# Patient Record
Sex: Female | Born: 1955 | Race: White | Hispanic: No | Smoking: Never smoker
Health system: Southern US, Community
[De-identification: ages and names within clinical notes are randomized; demographics above are authoritative.]

## PROBLEM LIST (undated history)

## (undated) DIAGNOSIS — K219 Gastro-esophageal reflux disease without esophagitis: Secondary | ICD-10-CM

## (undated) HISTORY — PX: OTHER SURGICAL HISTORY: SHX169

## (undated) HISTORY — PX: ABDOMINAL HYSTERECTOMY: SHX81

## (undated) HISTORY — PX: TUBAL LIGATION: SHX77

## (undated) HISTORY — PX: TONSILLECTOMY: SUR1361

---

## 2000-06-26 ENCOUNTER — Other Ambulatory Visit: Admission: RE | Admit: 2000-06-26 | Discharge: 2000-06-26 | Payer: Self-pay | Admitting: Obstetrics and Gynecology

## 2002-04-20 ENCOUNTER — Ambulatory Visit (HOSPITAL_BASED_OUTPATIENT_CLINIC_OR_DEPARTMENT_OTHER): Admission: RE | Admit: 2002-04-20 | Discharge: 2002-04-20 | Payer: Self-pay | Admitting: Orthopedic Surgery

## 2002-06-09 ENCOUNTER — Encounter: Admission: RE | Admit: 2002-06-09 | Discharge: 2002-06-09 | Payer: Self-pay | Admitting: Obstetrics and Gynecology

## 2002-06-09 ENCOUNTER — Encounter: Payer: Self-pay | Admitting: Obstetrics and Gynecology

## 2006-09-24 ENCOUNTER — Encounter: Admission: RE | Admit: 2006-09-24 | Discharge: 2006-09-24 | Payer: Self-pay | Admitting: Obstetrics and Gynecology

## 2014-03-17 ENCOUNTER — Other Ambulatory Visit: Payer: Self-pay

## 2014-03-17 DIAGNOSIS — Z1231 Encounter for screening mammogram for malignant neoplasm of breast: Secondary | ICD-10-CM

## 2014-04-26 ENCOUNTER — Ambulatory Visit
Admission: RE | Admit: 2014-04-26 | Discharge: 2014-04-26 | Disposition: A | Discharge status: Home / Self Care | Payer: BC Managed Care – PPO | Source: Ambulatory Visit

## 2014-04-26 ENCOUNTER — Encounter (INDEPENDENT_AMBULATORY_CARE_PROVIDER_SITE_OTHER): Payer: Self-pay

## 2014-04-26 DIAGNOSIS — Z1231 Encounter for screening mammogram for malignant neoplasm of breast: Secondary | ICD-10-CM

## 2015-03-22 ENCOUNTER — Other Ambulatory Visit: Payer: Self-pay | Admitting: Physician Assistant

## 2015-03-22 ENCOUNTER — Other Ambulatory Visit (HOSPITAL_COMMUNITY)
Admission: RE | Admit: 2015-03-22 | Discharge: 2015-03-22 | Disposition: A | Discharge status: Home / Self Care | Payer: 59 | Source: Ambulatory Visit | Attending: Family Medicine | Admitting: Family Medicine

## 2015-03-22 DIAGNOSIS — Z124 Encounter for screening for malignant neoplasm of cervix: Secondary | ICD-10-CM | POA: Diagnosis not present

## 2015-03-23 LAB — CYTOLOGY - PAP

## 2016-06-20 DIAGNOSIS — Z1159 Encounter for screening for other viral diseases: Secondary | ICD-10-CM | POA: Diagnosis not present

## 2016-06-20 DIAGNOSIS — Z0000 Encounter for general adult medical examination without abnormal findings: Secondary | ICD-10-CM | POA: Diagnosis not present

## 2016-06-20 DIAGNOSIS — I872 Venous insufficiency (chronic) (peripheral): Secondary | ICD-10-CM | POA: Diagnosis not present

## 2016-06-20 DIAGNOSIS — R079 Chest pain, unspecified: Secondary | ICD-10-CM | POA: Diagnosis not present

## 2016-06-21 ENCOUNTER — Other Ambulatory Visit: Payer: Self-pay | Admitting: Family Medicine

## 2016-06-21 DIAGNOSIS — E2839 Other primary ovarian failure: Secondary | ICD-10-CM

## 2016-06-27 ENCOUNTER — Ambulatory Visit
Admission: RE | Admit: 2016-06-27 | Discharge: 2016-06-27 | Disposition: A | Discharge status: Home / Self Care | Payer: BLUE CROSS/BLUE SHIELD | Source: Ambulatory Visit | Attending: Family Medicine | Admitting: Family Medicine

## 2016-06-27 DIAGNOSIS — E2839 Other primary ovarian failure: Secondary | ICD-10-CM

## 2016-06-27 DIAGNOSIS — Z780 Asymptomatic menopausal state: Secondary | ICD-10-CM | POA: Diagnosis not present

## 2016-06-27 DIAGNOSIS — M810 Age-related osteoporosis without current pathological fracture: Secondary | ICD-10-CM | POA: Diagnosis not present

## 2016-07-17 DIAGNOSIS — R079 Chest pain, unspecified: Secondary | ICD-10-CM | POA: Diagnosis not present

## 2016-07-17 DIAGNOSIS — E785 Hyperlipidemia, unspecified: Secondary | ICD-10-CM | POA: Diagnosis not present

## 2016-07-26 DIAGNOSIS — R0789 Other chest pain: Secondary | ICD-10-CM | POA: Diagnosis not present

## 2016-07-26 DIAGNOSIS — E785 Hyperlipidemia, unspecified: Secondary | ICD-10-CM | POA: Diagnosis not present

## 2016-08-07 DIAGNOSIS — I10 Essential (primary) hypertension: Secondary | ICD-10-CM | POA: Diagnosis not present

## 2016-08-07 DIAGNOSIS — E785 Hyperlipidemia, unspecified: Secondary | ICD-10-CM | POA: Diagnosis not present

## 2016-08-07 DIAGNOSIS — M2550 Pain in unspecified joint: Secondary | ICD-10-CM | POA: Diagnosis not present

## 2016-08-07 DIAGNOSIS — R079 Chest pain, unspecified: Secondary | ICD-10-CM | POA: Diagnosis not present

## 2016-08-13 ENCOUNTER — Other Ambulatory Visit: Payer: Self-pay | Admitting: Family Medicine

## 2016-08-13 DIAGNOSIS — Z1231 Encounter for screening mammogram for malignant neoplasm of breast: Secondary | ICD-10-CM

## 2016-09-05 ENCOUNTER — Ambulatory Visit
Admission: RE | Admit: 2016-09-05 | Discharge: 2016-09-05 | Disposition: A | Discharge status: Home / Self Care | Payer: BLUE CROSS/BLUE SHIELD | Source: Ambulatory Visit | Attending: Family Medicine | Admitting: Family Medicine

## 2016-09-05 DIAGNOSIS — Z1231 Encounter for screening mammogram for malignant neoplasm of breast: Secondary | ICD-10-CM

## 2016-11-15 DIAGNOSIS — R05 Cough: Secondary | ICD-10-CM | POA: Diagnosis not present

## 2016-12-17 DIAGNOSIS — R69 Illness, unspecified: Secondary | ICD-10-CM | POA: Diagnosis not present

## 2017-07-03 DIAGNOSIS — Z0000 Encounter for general adult medical examination without abnormal findings: Secondary | ICD-10-CM | POA: Diagnosis not present

## 2017-07-03 DIAGNOSIS — M818 Other osteoporosis without current pathological fracture: Secondary | ICD-10-CM | POA: Diagnosis not present

## 2017-07-03 DIAGNOSIS — Z113 Encounter for screening for infections with a predominantly sexual mode of transmission: Secondary | ICD-10-CM | POA: Diagnosis not present

## 2017-07-03 DIAGNOSIS — K219 Gastro-esophageal reflux disease without esophagitis: Secondary | ICD-10-CM | POA: Diagnosis not present

## 2017-07-03 DIAGNOSIS — I872 Venous insufficiency (chronic) (peripheral): Secondary | ICD-10-CM | POA: Diagnosis not present

## 2017-07-03 DIAGNOSIS — M25551 Pain in right hip: Secondary | ICD-10-CM | POA: Diagnosis not present

## 2017-07-10 DIAGNOSIS — M1611 Unilateral primary osteoarthritis, right hip: Secondary | ICD-10-CM | POA: Diagnosis not present

## 2017-07-10 DIAGNOSIS — M79671 Pain in right foot: Secondary | ICD-10-CM | POA: Diagnosis not present

## 2017-10-09 DIAGNOSIS — M1611 Unilateral primary osteoarthritis, right hip: Secondary | ICD-10-CM | POA: Diagnosis not present

## 2017-11-12 DIAGNOSIS — M7062 Trochanteric bursitis, left hip: Secondary | ICD-10-CM | POA: Diagnosis not present

## 2017-12-11 DIAGNOSIS — K219 Gastro-esophageal reflux disease without esophagitis: Secondary | ICD-10-CM | POA: Diagnosis not present

## 2017-12-30 DIAGNOSIS — Z8379 Family history of other diseases of the digestive system: Secondary | ICD-10-CM | POA: Diagnosis not present

## 2017-12-30 DIAGNOSIS — R635 Abnormal weight gain: Secondary | ICD-10-CM | POA: Diagnosis not present

## 2017-12-30 DIAGNOSIS — K219 Gastro-esophageal reflux disease without esophagitis: Secondary | ICD-10-CM | POA: Diagnosis not present

## 2017-12-30 DIAGNOSIS — R1310 Dysphagia, unspecified: Secondary | ICD-10-CM | POA: Diagnosis not present

## 2017-12-31 DIAGNOSIS — R1310 Dysphagia, unspecified: Secondary | ICD-10-CM | POA: Diagnosis not present

## 2017-12-31 DIAGNOSIS — K2970 Gastritis, unspecified, without bleeding: Secondary | ICD-10-CM | POA: Diagnosis not present

## 2017-12-31 DIAGNOSIS — K200 Eosinophilic esophagitis: Secondary | ICD-10-CM | POA: Diagnosis not present

## 2017-12-31 DIAGNOSIS — K219 Gastro-esophageal reflux disease without esophagitis: Secondary | ICD-10-CM | POA: Diagnosis not present

## 2018-01-20 DIAGNOSIS — K219 Gastro-esophageal reflux disease without esophagitis: Secondary | ICD-10-CM | POA: Diagnosis not present

## 2018-01-20 DIAGNOSIS — K200 Eosinophilic esophagitis: Secondary | ICD-10-CM | POA: Diagnosis not present

## 2018-01-20 DIAGNOSIS — J069 Acute upper respiratory infection, unspecified: Secondary | ICD-10-CM | POA: Diagnosis not present

## 2018-04-08 DIAGNOSIS — M436 Torticollis: Secondary | ICD-10-CM | POA: Diagnosis not present

## 2018-07-16 DIAGNOSIS — E7800 Pure hypercholesterolemia, unspecified: Secondary | ICD-10-CM | POA: Diagnosis not present

## 2018-07-16 DIAGNOSIS — Z0000 Encounter for general adult medical examination without abnormal findings: Secondary | ICD-10-CM | POA: Diagnosis not present

## 2018-07-16 DIAGNOSIS — K219 Gastro-esophageal reflux disease without esophagitis: Secondary | ICD-10-CM | POA: Diagnosis not present

## 2018-07-17 ENCOUNTER — Other Ambulatory Visit: Payer: Self-pay | Admitting: Family Medicine

## 2018-07-17 DIAGNOSIS — M818 Other osteoporosis without current pathological fracture: Principal | ICD-10-CM

## 2018-07-17 DIAGNOSIS — M810 Age-related osteoporosis without current pathological fracture: Secondary | ICD-10-CM

## 2018-07-20 ENCOUNTER — Encounter: Payer: Self-pay | Admitting: Radiology

## 2018-07-20 ENCOUNTER — Ambulatory Visit
Admission: RE | Admit: 2018-07-20 | Discharge: 2018-07-20 | Disposition: A | Discharge status: Home / Self Care | Payer: BLUE CROSS/BLUE SHIELD | Source: Ambulatory Visit | Attending: Family Medicine | Admitting: Family Medicine

## 2018-07-20 DIAGNOSIS — Z780 Asymptomatic menopausal state: Secondary | ICD-10-CM | POA: Diagnosis not present

## 2018-07-20 DIAGNOSIS — M810 Age-related osteoporosis without current pathological fracture: Secondary | ICD-10-CM | POA: Diagnosis not present

## 2018-07-20 DIAGNOSIS — M8588 Other specified disorders of bone density and structure, other site: Secondary | ICD-10-CM | POA: Diagnosis not present

## 2018-07-20 DIAGNOSIS — M818 Other osteoporosis without current pathological fracture: Principal | ICD-10-CM

## 2018-08-13 DIAGNOSIS — M818 Other osteoporosis without current pathological fracture: Secondary | ICD-10-CM | POA: Diagnosis not present

## 2018-10-21 DIAGNOSIS — R52 Pain, unspecified: Secondary | ICD-10-CM | POA: Diagnosis not present

## 2018-10-21 DIAGNOSIS — M19041 Primary osteoarthritis, right hand: Secondary | ICD-10-CM | POA: Diagnosis not present

## 2018-10-21 DIAGNOSIS — M180 Bilateral primary osteoarthritis of first carpometacarpal joints: Secondary | ICD-10-CM | POA: Diagnosis not present

## 2018-10-21 DIAGNOSIS — M19042 Primary osteoarthritis, left hand: Secondary | ICD-10-CM | POA: Diagnosis not present

## 2019-04-15 DIAGNOSIS — R1310 Dysphagia, unspecified: Secondary | ICD-10-CM | POA: Diagnosis not present

## 2019-04-15 DIAGNOSIS — K219 Gastro-esophageal reflux disease without esophagitis: Secondary | ICD-10-CM | POA: Diagnosis not present

## 2019-04-15 DIAGNOSIS — K200 Eosinophilic esophagitis: Secondary | ICD-10-CM | POA: Diagnosis not present

## 2019-05-04 DIAGNOSIS — M1612 Unilateral primary osteoarthritis, left hip: Secondary | ICD-10-CM | POA: Diagnosis not present

## 2019-05-04 DIAGNOSIS — M1611 Unilateral primary osteoarthritis, right hip: Secondary | ICD-10-CM | POA: Diagnosis not present

## 2019-05-19 DIAGNOSIS — I872 Venous insufficiency (chronic) (peripheral): Secondary | ICD-10-CM | POA: Diagnosis not present

## 2019-05-19 DIAGNOSIS — Z01818 Encounter for other preprocedural examination: Secondary | ICD-10-CM | POA: Diagnosis not present

## 2019-05-19 DIAGNOSIS — Z01812 Encounter for preprocedural laboratory examination: Secondary | ICD-10-CM | POA: Diagnosis not present

## 2019-05-27 DIAGNOSIS — M1611 Unilateral primary osteoarthritis, right hip: Secondary | ICD-10-CM | POA: Diagnosis not present

## 2019-05-28 DIAGNOSIS — M19072 Primary osteoarthritis, left ankle and foot: Secondary | ICD-10-CM | POA: Diagnosis not present

## 2019-05-28 DIAGNOSIS — M79671 Pain in right foot: Secondary | ICD-10-CM | POA: Diagnosis not present

## 2019-05-28 DIAGNOSIS — M25571 Pain in right ankle and joints of right foot: Secondary | ICD-10-CM | POA: Diagnosis not present

## 2019-05-28 DIAGNOSIS — M1611 Unilateral primary osteoarthritis, right hip: Secondary | ICD-10-CM | POA: Diagnosis not present

## 2019-05-28 DIAGNOSIS — M19071 Primary osteoarthritis, right ankle and foot: Secondary | ICD-10-CM | POA: Diagnosis not present

## 2019-05-28 DIAGNOSIS — M79672 Pain in left foot: Secondary | ICD-10-CM | POA: Diagnosis not present

## 2019-05-28 DIAGNOSIS — M25572 Pain in left ankle and joints of left foot: Secondary | ICD-10-CM | POA: Diagnosis not present

## 2019-06-04 DIAGNOSIS — Z96651 Presence of right artificial knee joint: Secondary | ICD-10-CM | POA: Diagnosis not present

## 2019-06-04 DIAGNOSIS — M1611 Unilateral primary osteoarthritis, right hip: Secondary | ICD-10-CM | POA: Diagnosis not present

## 2019-06-07 ENCOUNTER — Other Ambulatory Visit (HOSPITAL_COMMUNITY): Payer: Self-pay | Admitting: Orthopedic Surgery

## 2019-06-07 ENCOUNTER — Ambulatory Visit (HOSPITAL_COMMUNITY)
Admission: RE | Admit: 2019-06-07 | Discharge: 2019-06-07 | Disposition: A | Discharge status: Home / Self Care | Payer: BLUE CROSS/BLUE SHIELD | Source: Ambulatory Visit | Attending: Orthopedic Surgery | Admitting: Orthopedic Surgery

## 2019-06-07 ENCOUNTER — Other Ambulatory Visit: Payer: Self-pay

## 2019-06-07 DIAGNOSIS — M25551 Pain in right hip: Secondary | ICD-10-CM | POA: Insufficient documentation

## 2019-06-07 DIAGNOSIS — Z96641 Presence of right artificial hip joint: Secondary | ICD-10-CM | POA: Insufficient documentation

## 2019-06-07 DIAGNOSIS — M7989 Other specified soft tissue disorders: Secondary | ICD-10-CM | POA: Insufficient documentation

## 2019-06-07 DIAGNOSIS — M79604 Pain in right leg: Secondary | ICD-10-CM

## 2019-06-07 NOTE — Progress Notes (Signed)
RLE venous duplex       has been completed. Preliminary results can be found under CV proc through chart review. June Leap, BS, RDMS, RVT    Attempted call report to Dr. Mayer Camel with no answer.

## 2019-10-15 ENCOUNTER — Other Ambulatory Visit: Payer: Self-pay | Admitting: Family Medicine

## 2019-10-15 DIAGNOSIS — Z1231 Encounter for screening mammogram for malignant neoplasm of breast: Secondary | ICD-10-CM

## 2019-10-26 DIAGNOSIS — M67911 Unspecified disorder of synovium and tendon, right shoulder: Secondary | ICD-10-CM | POA: Diagnosis not present

## 2019-10-26 DIAGNOSIS — M25551 Pain in right hip: Secondary | ICD-10-CM | POA: Diagnosis not present

## 2019-10-26 DIAGNOSIS — M25552 Pain in left hip: Secondary | ICD-10-CM | POA: Diagnosis not present

## 2019-11-09 DIAGNOSIS — I83892 Varicose veins of left lower extremities with other complications: Secondary | ICD-10-CM | POA: Diagnosis not present

## 2019-11-09 DIAGNOSIS — I8312 Varicose veins of left lower extremity with inflammation: Secondary | ICD-10-CM | POA: Diagnosis not present

## 2019-11-25 DIAGNOSIS — I8312 Varicose veins of left lower extremity with inflammation: Secondary | ICD-10-CM | POA: Diagnosis not present

## 2020-01-21 DIAGNOSIS — Z0000 Encounter for general adult medical examination without abnormal findings: Secondary | ICD-10-CM | POA: Diagnosis not present

## 2020-01-21 DIAGNOSIS — M545 Low back pain: Secondary | ICD-10-CM | POA: Diagnosis not present

## 2020-02-01 ENCOUNTER — Other Ambulatory Visit: Payer: Self-pay | Admitting: Family Medicine

## 2020-02-01 DIAGNOSIS — M810 Age-related osteoporosis without current pathological fracture: Secondary | ICD-10-CM

## 2020-02-08 DIAGNOSIS — Z13220 Encounter for screening for lipoid disorders: Secondary | ICD-10-CM | POA: Diagnosis not present

## 2020-02-08 DIAGNOSIS — Z0000 Encounter for general adult medical examination without abnormal findings: Secondary | ICD-10-CM | POA: Diagnosis not present

## 2020-07-18 DIAGNOSIS — K200 Eosinophilic esophagitis: Secondary | ICD-10-CM | POA: Diagnosis not present

## 2020-07-18 DIAGNOSIS — K625 Hemorrhage of anus and rectum: Secondary | ICD-10-CM | POA: Diagnosis not present

## 2020-07-18 DIAGNOSIS — E669 Obesity, unspecified: Secondary | ICD-10-CM | POA: Diagnosis not present

## 2020-07-18 DIAGNOSIS — K219 Gastro-esophageal reflux disease without esophagitis: Secondary | ICD-10-CM | POA: Diagnosis not present

## 2020-07-20 ENCOUNTER — Other Ambulatory Visit: Payer: Self-pay | Admitting: Family Medicine

## 2020-07-20 DIAGNOSIS — Z1231 Encounter for screening mammogram for malignant neoplasm of breast: Secondary | ICD-10-CM

## 2020-11-02 ENCOUNTER — Other Ambulatory Visit: Payer: BLUE CROSS/BLUE SHIELD

## 2020-11-02 ENCOUNTER — Ambulatory Visit: Payer: BLUE CROSS/BLUE SHIELD

## 2021-10-04 DIAGNOSIS — M7061 Trochanteric bursitis, right hip: Secondary | ICD-10-CM | POA: Diagnosis not present

## 2021-11-15 DIAGNOSIS — M7989 Other specified soft tissue disorders: Secondary | ICD-10-CM | POA: Diagnosis not present

## 2021-11-15 DIAGNOSIS — I87392 Chronic venous hypertension (idiopathic) with other complications of left lower extremity: Secondary | ICD-10-CM | POA: Diagnosis not present

## 2021-11-15 DIAGNOSIS — I872 Venous insufficiency (chronic) (peripheral): Secondary | ICD-10-CM | POA: Diagnosis not present

## 2021-12-13 DIAGNOSIS — K200 Eosinophilic esophagitis: Secondary | ICD-10-CM | POA: Diagnosis not present

## 2021-12-13 DIAGNOSIS — E669 Obesity, unspecified: Secondary | ICD-10-CM | POA: Diagnosis not present

## 2021-12-13 DIAGNOSIS — K219 Gastro-esophageal reflux disease without esophagitis: Secondary | ICD-10-CM | POA: Diagnosis not present

## 2022-02-26 DIAGNOSIS — K219 Gastro-esophageal reflux disease without esophagitis: Secondary | ICD-10-CM | POA: Diagnosis not present

## 2022-02-26 DIAGNOSIS — M818 Other osteoporosis without current pathological fracture: Secondary | ICD-10-CM | POA: Diagnosis not present

## 2022-02-26 DIAGNOSIS — F430 Acute stress reaction: Secondary | ICD-10-CM | POA: Diagnosis not present

## 2022-02-26 DIAGNOSIS — Z6831 Body mass index (BMI) 31.0-31.9, adult: Secondary | ICD-10-CM | POA: Diagnosis not present

## 2022-02-27 ENCOUNTER — Other Ambulatory Visit: Payer: Self-pay | Admitting: Family Medicine

## 2022-02-27 DIAGNOSIS — M810 Age-related osteoporosis without current pathological fracture: Secondary | ICD-10-CM

## 2022-03-11 ENCOUNTER — Other Ambulatory Visit: Payer: Self-pay | Admitting: Family Medicine

## 2022-03-11 ENCOUNTER — Other Ambulatory Visit (HOSPITAL_BASED_OUTPATIENT_CLINIC_OR_DEPARTMENT_OTHER): Payer: Self-pay | Admitting: Family Medicine

## 2022-03-11 ENCOUNTER — Observation Stay (HOSPITAL_COMMUNITY)
Admission: EM | Admit: 2022-03-11 | Discharge: 2022-03-12 | Disposition: A | Discharge status: Home / Self Care | Payer: PPO | Attending: General Surgery | Admitting: General Surgery

## 2022-03-11 ENCOUNTER — Observation Stay (HOSPITAL_COMMUNITY): Payer: PPO | Admitting: Anesthesiology

## 2022-03-11 ENCOUNTER — Other Ambulatory Visit (HOSPITAL_COMMUNITY): Payer: Self-pay | Admitting: Family Medicine

## 2022-03-11 ENCOUNTER — Encounter (HOSPITAL_COMMUNITY): Payer: Self-pay | Admitting: Emergency Medicine

## 2022-03-11 ENCOUNTER — Observation Stay (HOSPITAL_BASED_OUTPATIENT_CLINIC_OR_DEPARTMENT_OTHER)
Admission: RE | Admit: 2022-03-11 | Discharge: 2022-03-11 | Disposition: A | Discharge status: Home / Self Care | Payer: PPO | Source: Ambulatory Visit | Attending: Family Medicine | Admitting: Family Medicine

## 2022-03-11 ENCOUNTER — Encounter (HOSPITAL_COMMUNITY)
Admission: EM | Disposition: A | Discharge status: Home / Self Care | Payer: Self-pay | Source: Home / Self Care | Attending: Emergency Medicine

## 2022-03-11 ENCOUNTER — Other Ambulatory Visit: Payer: Self-pay

## 2022-03-11 DIAGNOSIS — K3580 Unspecified acute appendicitis: Secondary | ICD-10-CM | POA: Diagnosis not present

## 2022-03-11 DIAGNOSIS — K3533 Acute appendicitis with perforation and localized peritonitis, with abscess: Secondary | ICD-10-CM

## 2022-03-11 DIAGNOSIS — M810 Age-related osteoporosis without current pathological fracture: Secondary | ICD-10-CM | POA: Insufficient documentation

## 2022-03-11 DIAGNOSIS — K219 Gastro-esophageal reflux disease without esophagitis: Secondary | ICD-10-CM | POA: Diagnosis not present

## 2022-03-11 DIAGNOSIS — R109 Unspecified abdominal pain: Secondary | ICD-10-CM | POA: Insufficient documentation

## 2022-03-11 DIAGNOSIS — R1032 Left lower quadrant pain: Secondary | ICD-10-CM

## 2022-03-11 DIAGNOSIS — R1030 Lower abdominal pain, unspecified: Secondary | ICD-10-CM | POA: Diagnosis present

## 2022-03-11 DIAGNOSIS — Z91040 Latex allergy status: Secondary | ICD-10-CM | POA: Insufficient documentation

## 2022-03-11 DIAGNOSIS — I872 Venous insufficiency (chronic) (peripheral): Secondary | ICD-10-CM | POA: Insufficient documentation

## 2022-03-11 DIAGNOSIS — R100 Acute abdomen: Secondary | ICD-10-CM | POA: Diagnosis not present

## 2022-03-11 DIAGNOSIS — K388 Other specified diseases of appendix: Secondary | ICD-10-CM | POA: Insufficient documentation

## 2022-03-11 DIAGNOSIS — F430 Acute stress reaction: Secondary | ICD-10-CM | POA: Insufficient documentation

## 2022-03-11 DIAGNOSIS — Z79899 Other long term (current) drug therapy: Secondary | ICD-10-CM | POA: Insufficient documentation

## 2022-03-11 DIAGNOSIS — I700 Atherosclerosis of aorta: Secondary | ICD-10-CM | POA: Diagnosis not present

## 2022-03-11 DIAGNOSIS — K3589 Other acute appendicitis without perforation or gangrene: Secondary | ICD-10-CM | POA: Diagnosis present

## 2022-03-11 DIAGNOSIS — K3521 Acute appendicitis with generalized peritonitis, with abscess: Secondary | ICD-10-CM | POA: Diagnosis not present

## 2022-03-11 DIAGNOSIS — Z6831 Body mass index (BMI) 31.0-31.9, adult: Secondary | ICD-10-CM | POA: Insufficient documentation

## 2022-03-11 DIAGNOSIS — R112 Nausea with vomiting, unspecified: Secondary | ICD-10-CM

## 2022-03-11 DIAGNOSIS — D121 Benign neoplasm of appendix: Secondary | ICD-10-CM | POA: Diagnosis not present

## 2022-03-11 HISTORY — PX: LAPAROSCOPY: SHX197

## 2022-03-11 HISTORY — PX: LAPAROSCOPIC LYSIS OF ADHESIONS: SHX5905

## 2022-03-11 HISTORY — PX: LAPAROSCOPIC APPENDECTOMY: SHX408

## 2022-03-11 HISTORY — DX: Gastro-esophageal reflux disease without esophagitis: K21.9

## 2022-03-11 LAB — COMPREHENSIVE METABOLIC PANEL
ALT: 17 U/L (ref 0–44)
AST: 33 U/L (ref 15–41)
Albumin: 4.4 g/dL (ref 3.5–5.0)
Alkaline Phosphatase: 73 U/L (ref 38–126)
Anion gap: 8 (ref 5–15)
BUN: 7 mg/dL — ABNORMAL LOW (ref 8–23)
CO2: 25 mmol/L (ref 22–32)
Calcium: 9.6 mg/dL (ref 8.9–10.3)
Chloride: 103 mmol/L (ref 98–111)
Creatinine, Ser: 0.73 mg/dL (ref 0.44–1.00)
GFR, Estimated: >60 mL/min (ref >60)
Glucose, Bld: 104 mg/dL — ABNORMAL HIGH (ref 70–99)
Potassium: 4.2 mmol/L (ref 3.5–5.1)
Sodium: 136 mmol/L (ref 135–145)
Total Bilirubin: 1.5 mg/dL — ABNORMAL HIGH (ref 0.3–1.2)
Total Protein: 8.2 g/dL — ABNORMAL HIGH (ref 6.5–8.1)

## 2022-03-11 LAB — CBC WITH DIFFERENTIAL/PLATELET
Abs Immature Granulocytes: 0.05 10*3/uL (ref 0.00–0.07)
Basophils Absolute: 0.1 10*3/uL (ref 0.0–0.1)
Basophils Relative: 0 %
Eosinophils Absolute: 0.1 10*3/uL (ref 0.0–0.5)
Eosinophils Relative: 0 %
HCT: 40.9 % (ref 36.0–46.0)
Hemoglobin: 14.2 g/dL (ref 12.0–15.0)
Immature Granulocytes: 0 %
Lymphocytes Relative: 13 %
Lymphs Abs: 2.1 10*3/uL (ref 0.7–4.0)
MCH: 33.7 pg (ref 26.0–34.0)
MCHC: 34.7 g/dL (ref 30.0–36.0)
MCV: 97.1 fL (ref 80.0–100.0)
Monocytes Absolute: 1.7 10*3/uL — ABNORMAL HIGH (ref 0.1–1.0)
Monocytes Relative: 10 %
Neutro Abs: 12.7 10*3/uL — ABNORMAL HIGH (ref 1.7–7.7)
Neutrophils Relative %: 77 %
Platelets: 386 10*3/uL (ref 150–400)
RBC: 4.21 MIL/uL (ref 3.87–5.11)
RDW: 12.4 % (ref 11.5–15.5)
WBC: 16.7 10*3/uL — ABNORMAL HIGH (ref 4.0–10.5)
nRBC: 0 % (ref 0.0–0.2)

## 2022-03-11 SURGERY — APPENDECTOMY, LAPAROSCOPIC
Anesthesia: General | Site: Abdomen

## 2022-03-11 MED ORDER — LACTATED RINGERS IV SOLN
INTRAVENOUS | Status: DC
Start: 2022-03-11 — End: 2022-03-11

## 2022-03-11 MED ORDER — SODIUM CHLORIDE 0.9 % IV SOLN: Freq: Three times a day (TID) | INTRAVENOUS | Status: DC | PRN

## 2022-03-11 MED ORDER — CHLORHEXIDINE GLUCONATE 0.12 % MT SOLN
15.0000 mL | Freq: Once | OROMUCOSAL | Status: AC
  Administered 2022-03-11: 15 mL via OROMUCOSAL

## 2022-03-11 MED ORDER — ONDANSETRON 4 MG PO TBDP: 4.0000 mg | ORAL_TABLET | Freq: Four times a day (QID) | ORAL | Status: DC | PRN

## 2022-03-11 MED ORDER — FENTANYL CITRATE (PF) 250 MCG/5ML IJ SOLN
INTRAMUSCULAR | Status: DC | PRN
  Administered 2022-03-11: 150 ug via INTRAVENOUS

## 2022-03-11 MED ORDER — BUPIVACAINE-EPINEPHRINE 0.25% -1:200000 IJ SOLN
INTRAMUSCULAR | Status: AC
  Filled 2022-03-11: qty 1

## 2022-03-11 MED ORDER — HYDROMORPHONE HCL 1 MG/ML IJ SOLN: 0.5000 mg | INTRAMUSCULAR | Status: DC | PRN

## 2022-03-11 MED ORDER — MIDAZOLAM HCL 2 MG/2ML IJ SOLN
INTRAMUSCULAR | Status: AC
  Filled 2022-03-11: qty 2

## 2022-03-11 MED ORDER — PROPOFOL 10 MG/ML IV BOLUS
INTRAVENOUS | Status: DC | PRN
  Administered 2022-03-11: 150 mg via INTRAVENOUS

## 2022-03-11 MED ORDER — METHOCARBAMOL 1000 MG/10ML IJ SOLN
1000.0000 mg | Freq: Four times a day (QID) | INTRAVENOUS | Status: DC | PRN
  Filled 2022-03-11: qty 10

## 2022-03-11 MED ORDER — LIP MEDEX EX OINT
1.0000 | TOPICAL_OINTMENT | Freq: Two times a day (BID) | CUTANEOUS | Status: DC
Start: 2022-03-11 — End: 2022-03-12
  Administered 2022-03-11 – 2022-03-12 (×2): 1 via TOPICAL
  Filled 2022-03-11: qty 7

## 2022-03-11 MED ORDER — SUCCINYLCHOLINE CHLORIDE 200 MG/10ML IV SOSY
PREFILLED_SYRINGE | INTRAVENOUS | Status: DC | PRN
Start: 2022-03-11 — End: 2022-03-11
  Administered 2022-03-11: 80 mg via INTRAVENOUS

## 2022-03-11 MED ORDER — MIDAZOLAM HCL 5 MG/5ML IJ SOLN
INTRAMUSCULAR | Status: DC | PRN
  Administered 2022-03-11: 2 mg via INTRAVENOUS

## 2022-03-11 MED ORDER — SODIUM CHLORIDE 0.9% FLUSH: 3.0000 mL | Freq: Two times a day (BID) | INTRAVENOUS | Status: DC

## 2022-03-11 MED ORDER — METOPROLOL TARTRATE 5 MG/5ML IV SOLN: 5.0000 mg | Freq: Four times a day (QID) | INTRAVENOUS | Status: DC | PRN

## 2022-03-11 MED ORDER — BISACODYL 10 MG RE SUPP
10.0000 mg | Freq: Every day | RECTAL | Status: DC
Start: 2022-03-11 — End: 2022-03-12
  Filled 2022-03-11: qty 1

## 2022-03-11 MED ORDER — DIPHENHYDRAMINE HCL 50 MG/ML IJ SOLN
12.5000 mg | Freq: Four times a day (QID) | INTRAMUSCULAR | Status: DC | PRN
Start: 2022-03-11 — End: 2022-03-12

## 2022-03-11 MED ORDER — MAGIC MOUTHWASH
15.0000 mL | Freq: Four times a day (QID) | ORAL | Status: DC | PRN
Start: 2022-03-11 — End: 2022-03-12
  Filled 2022-03-11: qty 15

## 2022-03-11 MED ORDER — ACETAMINOPHEN 500 MG PO TABS
1000.0000 mg | ORAL_TABLET | Freq: Four times a day (QID) | ORAL | Status: DC
  Administered 2022-03-11 – 2022-03-12 (×2): 1000 mg via ORAL
  Filled 2022-03-11 (×2): qty 2

## 2022-03-11 MED ORDER — FENTANYL CITRATE (PF) 250 MCG/5ML IJ SOLN
INTRAMUSCULAR | Status: AC
  Filled 2022-03-11: qty 5

## 2022-03-11 MED ORDER — DIPHENHYDRAMINE HCL 12.5 MG/5ML PO ELIX: 12.5000 mg | ORAL_SOLUTION | Freq: Four times a day (QID) | ORAL | Status: DC | PRN

## 2022-03-11 MED ORDER — ONDANSETRON HCL 4 MG/2ML IJ SOLN
INTRAMUSCULAR | Status: DC | PRN
  Administered 2022-03-11: 4 mg via INTRAVENOUS

## 2022-03-11 MED ORDER — MORPHINE SULFATE (PF) 2 MG/ML IV SOLN: 2.0000 mg | INTRAVENOUS | Status: DC | PRN

## 2022-03-11 MED ORDER — GENTAMICIN SULFATE 40 MG/ML IJ SOLN
7.0000 mg/kg | INTRAVENOUS | Status: DC
  Administered 2022-03-11: 400 mg via INTRAVENOUS
  Filled 2022-03-11 (×2): qty 10

## 2022-03-11 MED ORDER — METRONIDAZOLE 500 MG/100ML IV SOLN
500.0000 mg | Freq: Two times a day (BID) | INTRAVENOUS | Status: DC
  Administered 2022-03-12: 500 mg via INTRAVENOUS
  Filled 2022-03-11: qty 100

## 2022-03-11 MED ORDER — PROCHLORPERAZINE MALEATE 10 MG PO TABS
10.0000 mg | ORAL_TABLET | Freq: Four times a day (QID) | ORAL | Status: DC | PRN
  Filled 2022-03-11: qty 1

## 2022-03-11 MED ORDER — ROCURONIUM BROMIDE 10 MG/ML (PF) SYRINGE
PREFILLED_SYRINGE | INTRAVENOUS | Status: DC | PRN
  Administered 2022-03-11: 40 mg via INTRAVENOUS

## 2022-03-11 MED ORDER — PHENYLEPHRINE 80 MCG/ML (10ML) SYRINGE FOR IV PUSH (FOR BLOOD PRESSURE SUPPORT)
PREFILLED_SYRINGE | INTRAVENOUS | Status: AC
  Filled 2022-03-11: qty 20

## 2022-03-11 MED ORDER — ACETAMINOPHEN 325 MG PO TABS: 650.0000 mg | ORAL_TABLET | Freq: Four times a day (QID) | ORAL | Status: DC | PRN

## 2022-03-11 MED ORDER — BUPIVACAINE-EPINEPHRINE 0.25% -1:200000 IJ SOLN
INTRAMUSCULAR | Status: DC | PRN
  Administered 2022-03-11: 50 mL

## 2022-03-11 MED ORDER — LACTATED RINGERS IR SOLN
Status: DC | PRN
  Administered 2022-03-11: 2000 mL

## 2022-03-11 MED ORDER — CIPROFLOXACIN IN D5W 400 MG/200ML IV SOLN
400.0000 mg | INTRAVENOUS | Status: AC
  Administered 2022-03-11: 400 mg via INTRAVENOUS
  Filled 2022-03-11: qty 200

## 2022-03-11 MED ORDER — CIPROFLOXACIN IN D5W 400 MG/200ML IV SOLN
400.0000 mg | Freq: Two times a day (BID) | INTRAVENOUS | Status: DC
  Administered 2022-03-12: 400 mg via INTRAVENOUS
  Filled 2022-03-11: qty 200

## 2022-03-11 MED ORDER — CHLORHEXIDINE GLUCONATE CLOTH 2 % EX PADS
6.0000 | MEDICATED_PAD | Freq: Once | CUTANEOUS | Status: AC
  Administered 2022-03-11: 6 via TOPICAL

## 2022-03-11 MED ORDER — TRAMADOL HCL 50 MG PO TABS
50.0000 mg | ORAL_TABLET | Freq: Four times a day (QID) | ORAL | Status: DC | PRN
  Administered 2022-03-11: 50 mg via ORAL
  Filled 2022-03-11: qty 1

## 2022-03-11 MED ORDER — FENTANYL CITRATE PF 50 MCG/ML IJ SOSY: 25.0000 ug | PREFILLED_SYRINGE | INTRAMUSCULAR | Status: DC | PRN

## 2022-03-11 MED ORDER — KETOROLAC TROMETHAMINE 15 MG/ML IJ SOLN: 15.0000 mg | Freq: Once | INTRAMUSCULAR | Status: DC | PRN

## 2022-03-11 MED ORDER — BUPIVACAINE LIPOSOME 1.3 % IJ SUSP
INTRAMUSCULAR | Status: AC
  Filled 2022-03-11: qty 20

## 2022-03-11 MED ORDER — PROPOFOL 10 MG/ML IV BOLUS
INTRAVENOUS | Status: AC
  Filled 2022-03-11: qty 20

## 2022-03-11 MED ORDER — ENALAPRILAT 1.25 MG/ML IV SOLN
0.6250 mg | Freq: Four times a day (QID) | INTRAVENOUS | Status: DC | PRN
  Filled 2022-03-11: qty 1

## 2022-03-11 MED ORDER — ONDANSETRON HCL 4 MG/2ML IJ SOLN: 4.0000 mg | Freq: Four times a day (QID) | INTRAMUSCULAR | Status: DC | PRN

## 2022-03-11 MED ORDER — LACTATED RINGERS IV SOLN: INTRAVENOUS | Status: AC

## 2022-03-11 MED ORDER — GABAPENTIN 300 MG PO CAPS
300.0000 mg | ORAL_CAPSULE | Freq: Two times a day (BID) | ORAL | Status: DC
  Administered 2022-03-11: 300 mg via ORAL
  Filled 2022-03-11: qty 1

## 2022-03-11 MED ORDER — SUCCINYLCHOLINE CHLORIDE 200 MG/10ML IV SOSY
PREFILLED_SYRINGE | INTRAVENOUS | Status: AC
  Filled 2022-03-11: qty 10

## 2022-03-11 MED ORDER — METHOCARBAMOL 500 MG PO TABS: 500.0000 mg | ORAL_TABLET | Freq: Four times a day (QID) | ORAL | Status: DC | PRN

## 2022-03-11 MED ORDER — ONDANSETRON HCL 4 MG/2ML IJ SOLN: 4.0000 mg | Freq: Once | INTRAMUSCULAR | Status: DC | PRN

## 2022-03-11 MED ORDER — MORPHINE SULFATE (PF) 4 MG/ML IV SOLN
4.0000 mg | Freq: Once | INTRAVENOUS | Status: AC
  Administered 2022-03-11: 4 mg via INTRAVENOUS
  Filled 2022-03-11: qty 1

## 2022-03-11 MED ORDER — ROCURONIUM BROMIDE 10 MG/ML (PF) SYRINGE
PREFILLED_SYRINGE | INTRAVENOUS | Status: AC
  Filled 2022-03-11: qty 10

## 2022-03-11 MED ORDER — PANTOPRAZOLE SODIUM 40 MG IV SOLR
40.0000 mg | INTRAVENOUS | Status: DC
  Filled 2022-03-11: qty 10

## 2022-03-11 MED ORDER — SODIUM CHLORIDE 0.9 % IV BOLUS
1000.0000 mL | Freq: Once | INTRAVENOUS | Status: AC
  Administered 2022-03-11: 1000 mL via INTRAVENOUS

## 2022-03-11 MED ORDER — DEXAMETHASONE SODIUM PHOSPHATE 10 MG/ML IJ SOLN
INTRAMUSCULAR | Status: DC | PRN
  Administered 2022-03-11: 8 mg via INTRAVENOUS

## 2022-03-11 MED ORDER — MAGNESIUM HYDROXIDE 400 MG/5ML PO SUSP: 30.0000 mL | Freq: Every day | ORAL | Status: DC | PRN

## 2022-03-11 MED ORDER — TRAMADOL HCL 50 MG PO TABS: 50.0000 mg | ORAL_TABLET | Freq: Four times a day (QID) | ORAL | Status: DC | PRN

## 2022-03-11 MED ORDER — LIDOCAINE HCL (PF) 2 % IJ SOLN
INTRAMUSCULAR | Status: AC
  Filled 2022-03-11: qty 5

## 2022-03-11 MED ORDER — ONDANSETRON HCL 4 MG/2ML IJ SOLN
INTRAMUSCULAR | Status: AC
  Filled 2022-03-11: qty 2

## 2022-03-11 MED ORDER — ACETAMINOPHEN 10 MG/ML IV SOLN: 1000.0000 mg | Freq: Once | INTRAVENOUS | Status: DC | PRN

## 2022-03-11 MED ORDER — 0.9 % SODIUM CHLORIDE (POUR BTL) OPTIME
TOPICAL | Status: DC | PRN
  Administered 2022-03-11: 1000 mL

## 2022-03-11 MED ORDER — LACTATED RINGERS IV SOLN: INTRAVENOUS | Status: DC

## 2022-03-11 MED ORDER — FENTANYL CITRATE (PF) 100 MCG/2ML IJ SOLN
INTRAMUSCULAR | Status: DC | PRN
  Administered 2022-03-11: 50 ug via INTRAVENOUS
  Administered 2022-03-11: 100 ug via INTRAVENOUS

## 2022-03-11 MED ORDER — LACTATED RINGERS IV BOLUS
1000.0000 mL | Freq: Three times a day (TID) | INTRAVENOUS | Status: DC | PRN
Start: 2022-03-11 — End: 2022-03-12

## 2022-03-11 MED ORDER — ADULT MULTIVITAMIN W/MINERALS CH
1.0000 | ORAL_TABLET | Freq: Every day | ORAL | Status: DC
  Administered 2022-03-11 – 2022-03-12 (×2): 1 via ORAL
  Filled 2022-03-11 (×2): qty 1

## 2022-03-11 MED ORDER — LIDOCAINE 2% (20 MG/ML) 5 ML SYRINGE
INTRAMUSCULAR | Status: DC | PRN
  Administered 2022-03-11: 60 mg via INTRAVENOUS

## 2022-03-11 MED ORDER — ENOXAPARIN SODIUM 40 MG/0.4ML IJ SOSY
40.0000 mg | PREFILLED_SYRINGE | INTRAMUSCULAR | Status: DC
  Administered 2022-03-12: 40 mg via SUBCUTANEOUS
  Filled 2022-03-11: qty 0.4

## 2022-03-11 MED ORDER — ONDANSETRON HCL 4 MG/2ML IJ SOLN
4.0000 mg | Freq: Once | INTRAMUSCULAR | Status: AC
Start: 2022-03-11 — End: 2022-03-11
  Administered 2022-03-11: 4 mg via INTRAVENOUS
  Filled 2022-03-11: qty 2

## 2022-03-11 MED ORDER — AMISULPRIDE (ANTIEMETIC) 5 MG/2ML IV SOLN: 10.0000 mg | Freq: Once | INTRAVENOUS | Status: DC | PRN

## 2022-03-11 MED ORDER — PROCHLORPERAZINE EDISYLATE 10 MG/2ML IJ SOLN: 5.0000 mg | Freq: Four times a day (QID) | INTRAMUSCULAR | Status: DC | PRN

## 2022-03-11 MED ORDER — PHENYLEPHRINE 80 MCG/ML (10ML) SYRINGE FOR IV PUSH (FOR BLOOD PRESSURE SUPPORT)
PREFILLED_SYRINGE | INTRAVENOUS | Status: DC | PRN
  Administered 2022-03-11: 80 ug via INTRAVENOUS
  Administered 2022-03-11: 120 ug via INTRAVENOUS
  Administered 2022-03-11 (×2): 80 ug via INTRAVENOUS
  Administered 2022-03-11 (×3): 120 ug via INTRAVENOUS

## 2022-03-11 MED ORDER — DEXAMETHASONE SODIUM PHOSPHATE 10 MG/ML IJ SOLN
INTRAMUSCULAR | Status: AC
  Filled 2022-03-11: qty 1

## 2022-03-11 MED ORDER — BUPIVACAINE LIPOSOME 1.3 % IJ SUSP
INTRAMUSCULAR | Status: DC | PRN
  Administered 2022-03-11: 20 mL

## 2022-03-11 MED ORDER — SIMETHICONE 80 MG PO CHEW: 40.0000 mg | CHEWABLE_TABLET | Freq: Four times a day (QID) | ORAL | Status: DC | PRN

## 2022-03-11 MED ORDER — SODIUM CHLORIDE 0.9 % IV SOLN: 250.0000 mL | INTRAVENOUS | Status: DC | PRN

## 2022-03-11 MED ORDER — SODIUM CHLORIDE 0.9% FLUSH: 3.0000 mL | INTRAVENOUS | Status: DC | PRN

## 2022-03-11 MED ORDER — METRONIDAZOLE 500 MG/100ML IV SOLN
500.0000 mg | INTRAVENOUS | Status: AC
  Administered 2022-03-11: 500 mg via INTRAVENOUS
  Filled 2022-03-11: qty 100

## 2022-03-11 MED ORDER — BISACODYL 10 MG RE SUPP: 10.0000 mg | Freq: Every day | RECTAL | Status: DC | PRN

## 2022-03-11 MED ORDER — SUGAMMADEX SODIUM 200 MG/2ML IV SOLN
INTRAVENOUS | Status: DC | PRN
  Administered 2022-03-11: 200 mg via INTRAVENOUS

## 2022-03-11 MED ORDER — IOHEXOL 300 MG/ML  SOLN
100.0000 mL | Freq: Once | INTRAMUSCULAR | Status: AC | PRN
  Administered 2022-03-11: 75 mL via INTRAVENOUS

## 2022-03-11 SURGICAL SUPPLY — 48 items
ADH SKN CLS APL DERMABOND .7 (GAUZE/BANDAGES/DRESSINGS) ×1
APL PRP STRL LF DISP 70% ISPRP (MISCELLANEOUS) ×1
APPLIER CLIP ROT 10 11.4 M/L (STAPLE)
APR CLP MED LRG 11.4X10 (STAPLE)
BAG COUNTER SPONGE SURGICOUNT (BAG) IMPLANT
BAG RETRIEVAL 10 (BASKET) ×1
BAG SPNG CNTER NS LX DISP (BAG)
CABLE HIGH FREQUENCY MONO STRZ (ELECTRODE) ×2 IMPLANT
CHLORAPREP W/TINT 26 (MISCELLANEOUS) ×2 IMPLANT
CLIP APPLIE ROT 10 11.4 M/L (STAPLE) IMPLANT
CUTTER FLEX LINEAR 45M (STAPLE) IMPLANT
DERMABOND ADVANCED (GAUZE/BANDAGES/DRESSINGS) ×1
DERMABOND ADVANCED .7 DNX12 (GAUZE/BANDAGES/DRESSINGS) ×1 IMPLANT
DRSG TEGADERM 2-3/8X2-3/4 SM (GAUZE/BANDAGES/DRESSINGS) ×2 IMPLANT
DRSG TEGADERM 4X4.75 (GAUZE/BANDAGES/DRESSINGS) ×1 IMPLANT
ELECT REM PT RETURN 15FT ADLT (MISCELLANEOUS) ×2 IMPLANT
ENDOLOOP SUT PDS II  0 18 (SUTURE)
ENDOLOOP SUT PDS II 0 18 (SUTURE) IMPLANT
GAUZE SPONGE 2X2 8PLY STRL LF (GAUZE/BANDAGES/DRESSINGS) IMPLANT
GOWN STRL REUS W/ TWL LRG LVL3 (GOWN DISPOSABLE) IMPLANT
GOWN STRL REUS W/TWL LRG LVL3 (GOWN DISPOSABLE)
IRRIG SUCT STRYKERFLOW 2 WTIP (MISCELLANEOUS) ×2
IRRIGATION SUCT STRKRFLW 2 WTP (MISCELLANEOUS) ×1 IMPLANT
KIT BASIN OR (CUSTOM PROCEDURE TRAY) ×2 IMPLANT
KIT TURNOVER KIT A (KITS) IMPLANT
PENCIL SMOKE EVACUATOR (MISCELLANEOUS) IMPLANT
RELOAD 45 VASCULAR/THIN (ENDOMECHANICALS) IMPLANT
RELOAD STAPLE 45 2.5 WHT GRN (ENDOMECHANICALS) IMPLANT
RELOAD STAPLE 45 3.5 BLU ETS (ENDOMECHANICALS) IMPLANT
RELOAD STAPLE 60 3.6 BLU REG (STAPLE) IMPLANT
RELOAD STAPLE TA45 3.5 REG BLU (ENDOMECHANICALS) IMPLANT
RELOAD STAPLER BLUE 60MM (STAPLE) ×1 IMPLANT
SCISSORS LAP 5X35 DISP (ENDOMECHANICALS) ×2 IMPLANT
SET TUBE SMOKE EVAC HIGH FLOW (TUBING) ×2 IMPLANT
SHEARS HARMONIC ACE PLUS 36CM (ENDOMECHANICALS) ×3 IMPLANT
SPIKE FLUID TRANSFER (MISCELLANEOUS) ×2 IMPLANT
SPONGE GAUZE 2X2 STER 10/PKG (GAUZE/BANDAGES/DRESSINGS) ×1
STAPLER ECHELON LONG 60 440 (INSTRUMENTS) ×1 IMPLANT
STAPLER RELOAD BLUE 60MM (STAPLE) ×2
SUT MNCRL AB 4-0 PS2 18 (SUTURE) ×2 IMPLANT
SUT PDS AB 1 CT1 27 (SUTURE) ×2 IMPLANT
SYS BAG RETRIEVAL 10MM (BASKET) ×1
SYSTEM BAG RETRIEVAL 10MM (BASKET) ×1 IMPLANT
TOWEL OR 17X26 10 PK STRL BLUE (TOWEL DISPOSABLE) ×2 IMPLANT
TRAY FOLEY W/BAG SLVR 14FR LF (SET/KITS/TRAYS/PACK) ×1 IMPLANT
TRAY LAPAROSCOPIC (CUSTOM PROCEDURE TRAY) ×2 IMPLANT
TROCAR BLADELESS OPT 5 100 (ENDOMECHANICALS) ×2 IMPLANT
TROCAR XCEL BLUNT TIP 100MML (ENDOMECHANICALS) ×2 IMPLANT

## 2022-03-11 NOTE — Discharge Instructions (Addendum)
SURGERY: POST OP INSTRUCTIONS ?(Surgery for small bowel obstruction, colon resection, etc) ? ? ?###################################################################### ? ?EAT ?Gradually transition to a high fiber diet with a fiber supplement over the next few days after discharge ? ?WALK ?Walk an hour a day.  Control your pain to do that.   ? ?CONTROL PAIN ?Control pain so that you can walk, sleep, tolerate sneezing/coughing, go up/down stairs. ? ?HAVE A BOWEL MOVEMENT DAILY ?Keep your bowels regular to avoid problems.  OK to try a laxative to override constipation.  OK to use an antidairrheal to slow down diarrhea.  Call if not better after 2 tries ? ?CALL IF YOU HAVE PROBLEMS/CONCERNS ?Call if you are still struggling despite following these instructions. ?Call if you have concerns not answered by these instructions ? ?###################################################################### ? ? ?DIET ?Follow a light diet the first few days at home.  Start with a bland diet such as soups, liquids, starchy foods, low fat foods, etc.  If you feel full, bloated, or constipated, stay on a ful liquid or pureed/blenderized diet for a few days until you feel better and no longer constipated. ?Be sure to drink plenty of fluids every day to avoid getting dehydrated (feeling dizzy, not urinating, etc.). ?Gradually add a fiber supplement to your diet over the next week.  Gradually get back to a regular solid diet.  Avoid fast food or heavy meals the first week as you are more likely to get nauseated. ?It is expected for your digestive tract to need a few months to get back to normal.  It is common for your bowel movements and stools to be irregular.  You will have occasional bloating and cramping that should eventually fade away.  Until you are eating solid food normally, off all pain medications, and back to regular activities; your bowels will not be normal. ?Focus on eating a low-fat, high fiber diet the rest of your life  (See Getting to [place], below). ? ?CARE of your INCISION or WOUND ? ?It is good for closed incisions and even open wounds to be washed every day.  Shower every day.  Short baths are fine.  Wash the incisions and wounds clean with soap & water.    ?You may leave closed incisions open to air if it is dry.   You may cover the incision with clean gauze & replace it after your daily shower for comfort. ? ?TEGADERM:  You have clear gauze band-aid dressings over your closed incision(s).  Remove the dressings 3 days after surgery. ? ? ? ? ?If you have an open wound with a wound vac, see wound vac care instructions. ? ? ? ? ?ACTIVITIES as tolerated ?Start light daily activities --- self-care, walking, climbing stairs-- beginning the day after surgery.  Gradually increase activities as tolerated.  Control your pain to be active.  Stop when you are tired.  Ideally, walk several times a day, eventually an hour a day.   ?Most people are back to most day-to-day activities in a few weeks.  It takes 4-8 weeks to get back to unrestricted, intense activity. ?If you can walk 30 minutes without difficulty, it is safe to try more intense activity such as jogging, treadmill, bicycling, low-impact aerobics, swimming, etc. ?Save the most intensive and strenuous activity for last (Usually 4-8 weeks after surgery) such as sit-ups, heavy lifting, contact sports, etc.  Refrain from any intense heavy lifting or straining until you are off narcotics for pain control.  You will have off  days, but things should improve week-by-week. ?DO NOT PUSH THROUGH PAIN.  Let pain be your guide: If it hurts to do something, don't do it.  Pain is your body warning you to avoid that activity for another week until the pain goes down. ?You may drive when you are no longer taking narcotic prescription pain medication, you can comfortably wear a seatbelt, and you can safely make sudden turns/stops to protect yourself without hesitating due to pain. ?You  may have sexual intercourse when it is comfortable. If it hurts to do something, stop. ? ?MEDICATIONS ?Take your usually prescribed home medications unless otherwise directed.   ?Blood thinners:  ?Usually you can restart any strong blood thinners after the second postoperative day.  It is OK to take aspirin right away.    ? If you are on strong blood thinners (warfarin/Coumadin, Plavix, Xerelto, Eliquis, Pradaxa, etc), discuss with your surgeon, medicine PCP, and/or cardiologist for instructions on when to restart the blood thinner & if blood monitoring is needed (PT/INR blood check, etc).   ? ? ?PAIN CONTROL ?Pain after surgery or related to activity is often due to strain/injury to muscle, tendon, nerves and/or incisions.  This pain is usually short-term and will improve in a few months.  ?To help speed the process of healing and to get back to regular activity more quickly, DO THE FOLLOWING THINGS TOGETHER: ?Increase activity gradually.  DO NOT PUSH THROUGH PAIN ?Use Ice and/or Heat ?Try Gentle Massage and/or Stretching ?Take over the counter pain medication ?Take Narcotic prescription pain medication for more severe pain ? ?Good pain control = faster recovery.  It is better to take more medicine to be more active than to stay in bed all day to avoid medications. ? Increase activity gradually ?Avoid heavy lifting at first, then increase to lifting as tolerated over the next 6 weeks. ?Do not ?push through? the pain.  Listen to your body and avoid positions and maneuvers than reproduce the pain.  Wait a few days before trying something more intense ?Walking an hour a day is encouraged to help your body recover faster and more safely.  Start slowly and stop when getting sore.  If you can walk 30 minutes without stopping or pain, you can try more intense activity (running, jogging, aerobics, cycling, swimming, treadmill, sex, sports, weightlifting, etc.) ?Remember: If it hurts to do it, then don?t do it! ?Use Ice  and/or Heat ?You will have swelling and bruising around the incisions.  This will take several weeks to resolve. ?Ice packs or heating pads (6-8 times a day, 30-60 minutes at a time) will help sooth soreness & bruising. ?Some people prefer to use ice alone, heat alone, or alternate between ice & heat.  Experiment and see what works best for you.  Consider trying ice for the first few days to help decrease swelling and bruising; then, switch to heat to help relax sore spots and speed recovery. ?Shower every day.  Short baths are fine.  It feels good!  Keep the incisions and wounds clean with soap & water.   ?Try Gentle Massage and/or Stretching ?Massage at the area of pain many times a day ?Stop if you feel pain - do not overdo it ?Take over the counter pain medication ?This helps the muscle and nerve tissues become less irritable and calm down faster ?Choose ONE of the following over-the-counter anti-inflammatory medications: ?Acetaminophen '500mg'$  tabs (Tylenol) 1-2 pills with every meal and just before bedtime (avoid if you have liver  problems or if you have acetaminophen in you narcotic prescription) ?Naproxen '220mg'$  tabs (ex. Aleve, Naprosyn) 1-2 pills twice a day (avoid if you have kidney, stomach, IBD, or bleeding problems) ?Ibuprofen '200mg'$  tabs (ex. Advil, Motrin) 3-4 pills with every meal and just before bedtime (avoid if you have kidney, stomach, IBD, or bleeding problems) ?Take with food/snack several times a day as directed for at least 2 weeks to help keep pain / soreness down & more manageable. ?Take Narcotic prescription pain medication for more severe pain ?A prescription for strong pain control is often given to you upon discharge (for example: oxycodone/Percocet, hydrocodone/Norco/Vicodin, or tramadol/Ultram) ?Take your pain medication as prescribed. ?Be mindful that most narcotic prescriptions contain Tylenol (acetaminophen) as well - avoid taking too much Tylenol. ?If you are having problems/concerns  with the prescription medicine (does not control pain, nausea, vomiting, rash, itching, etc.), please call us [phone] to see if we need to switch you to a different pain medicine that will work bet

## 2022-03-11 NOTE — Anesthesia Preprocedure Evaluation (Addendum)
Anesthesia Evaluation  ?Patient identified by MRN, date of birth, ID band ?Patient awake ? ? ? ?Reviewed: ?Allergy & Precautions, NPO status , Patient's Chart, lab work & pertinent test results ? ?Airway ?Mallampati: II ? ?TM Distance: >3 FB ?Neck ROM: Full ? ? ? Dental ?no notable dental hx. ? ?  ?Pulmonary ?neg pulmonary ROS,  ?  ?Pulmonary exam normal ? ? ? ? ? ? ? Cardiovascular ?negative cardio ROS ?Normal cardiovascular exam ? ? ?  ?Neuro/Psych ?negative neurological ROS ? negative psych ROS  ? GI/Hepatic ?GERD  Medicated and Controlled,(+)  ?  ? substance abuse ? ,   ?Endo/Other  ?negative endocrine ROS ? Renal/GU ?negative Renal ROS  ? ?  ?Musculoskeletal ?negative musculoskeletal ROS ?(+)  ? Abdominal ?  ?Peds ? Hematology ?negative hematology ROS ?(+)   ?Anesthesia Other Findings ?APPENDICITIS ? Reproductive/Obstetrics ? ?  ? ? ? ? ? ? ? ? ? ? ? ? ? ?  ?  ? ? ? ? ? ? ? ?Anesthesia Physical ?Anesthesia Plan ? ?ASA: 2 ? ?Anesthesia Plan: General  ? ?Post-op Pain Management:   ? ?Induction: Intravenous ? ?PONV Risk Score and Plan: 4 or greater and Ondansetron, Dexamethasone, Midazolam and Treatment may vary due to age or medical condition ? ?Airway Management Planned: Oral ETT ? ?Additional Equipment:  ? ?Intra-op Plan:  ? ?Post-operative Plan: Extubation in OR ? ?Informed Consent: I have reviewed the patients History and Physical, chart, labs and discussed the procedure including the risks, benefits and alternatives for the proposed anesthesia with the patient or authorized representative who has indicated his/her understanding and acceptance.  ? ? ? ?Dental advisory given ? ?Plan Discussed with: CRNA and Surgeon ? ?Anesthesia Plan Comments:   ? ? ? ? ?Anesthesia Quick Evaluation ? ?

## 2022-03-11 NOTE — Plan of Care (Signed)
  Problem: Nutrition: Goal: Adequate nutrition will be maintained Outcome: Progressing   

## 2022-03-11 NOTE — ED Provider Notes (Signed)
?[facility] DEPT ?Provider Note ? ? ?CSN: [id number] ?Arrival date & time: 03/11/22  1514 ? ?  ? ?History ? ?No chief complaint on file. ? ? ?Ashley [name] is a 66 y.o. female here presenting with abdominal pain.  Patient states that she has been having lower abdominal pain for the last 3 days.  Patient states that she is nauseated as well.  Patient went to see her doctor a had outpatient CT that showed appendicitis.  Patient was sent here for surgical eval.  Last meal was around 1 PM. Not on blood thinners.  ? ?The history is provided by the patient.  ? ?  ? ?Home Medications ?Prior to Admission medications   ?Not on File  ?   ? ?Allergies    ?Latex, Penicillins, and Sulfa antibiotics   ? ?Review of Systems   ?Review of Systems  ?Gastrointestinal:  Positive for abdominal pain.  ?All other systems reviewed and are negative. ? ?Physical Exam ?Updated Vital Signs ?BP (!) 160/102   Pulse (!) 111   Temp 98.6 ?F (37 ?C) (Oral)   Resp 20   SpO2 97%  ?Physical Exam ?Vitals and nursing note reviewed.  ?Constitutional:   ?   Appearance: Normal appearance.  ?HENT:  ?   Head: Normocephalic.  ?   Nose: Nose normal.  ?   Mouth/Throat:  ?   Mouth: Mucous membranes are dry.  ?Eyes:  ?   Extraocular Movements: Extraocular movements intact.  ?   Pupils: Pupils are equal, round, and reactive to light.  ?Cardiovascular:  ?   Rate and Rhythm: Normal rate and regular rhythm.  ?   Pulses: Normal pulses.  ?   Heart sounds: Normal heart sounds.  ?Pulmonary:  ?   Effort: Pulmonary effort is normal.  ?   Breath sounds: Normal breath sounds.  ?Abdominal:  ?   General: Abdomen is flat.  ?   Palpations: Abdomen is soft.  ?   Comments: + RLQ tenderness and periumbilical tenderness, mild guarding   ?Musculoskeletal:     ?   General: Normal range of motion.  ?   Cervical back: Normal range of motion and neck supple.  ?Skin: ?   General: Skin is warm.  ?   Capillary Refill: Capillary refill takes less than 2  seconds.  ?Neurological:  ?   General: No focal deficit present.  ?   Mental Status: She is alert and oriented to person, place, and time.  ?Psychiatric:     ?   Mood and Affect: Mood normal.     ?   Behavior: Behavior normal.  ? ? ?ED Results / Procedures / Treatments   ?Labs ?(all labs ordered are listed, but only abnormal results are displayed) ?Labs Reviewed  ?CBC WITH DIFFERENTIAL/PLATELET  ?COMPREHENSIVE METABOLIC PANEL  ? ? ?EKG ?None ? ?Radiology ?CT ABDOMEN PELVIS W CONTRAST ? ?Result Date: 03/11/2022 ?CLINICAL DATA:  Abdominal pain for 3 days. EXAM: CT ABDOMEN AND PELVIS WITH CONTRAST TECHNIQUE: Multidetector CT imaging of the abdomen and pelvis was performed using the standard protocol following bolus administration of intravenous contrast. RADIATION DOSE REDUCTION: This exam was performed according to the departmental dose-optimization program which includes automated exposure control, adjustment of the mA and/or kV according to patient size and/or use of iterative reconstruction technique. CONTRAST:  85m OMNIPAQUE IOHEXOL 300 MG/ML  SOLN COMPARISON:  None Available. FINDINGS: Lower chest: No pleural effusion or airspace consolidation. Hepatobiliary: Tiny low-density structure within medial aspect of  the inferior right lobe of liver measures 6 mm and is too small to characterize. No suspicious focal liver abnormality. The gallbladder appears normal. No bile duct dilatation. Pancreas: Unremarkable. No pancreatic ductal dilatation or surrounding inflammatory changes. Spleen: Normal in size without focal abnormality. Adrenals/Urinary Tract: Normal adrenal glands. No kidney mass, nephrolithiasis or hydronephrosis identified. Urinary bladder is unremarkable. Stomach/Bowel: There is marked inflammation involving the appendix which appears distended measuring 2.1 cm in maximum diameter. Small appendicolith is identified within the distal lumen. Surrounding soft tissue stranding is identified. Imaging findings  are compatible with acute appendicitis. Signs of secondary inflammation of the sigmoid colon identified, image 66/2. Secondary inflammation of the right ovary is also suspected which appears asymmetrically enlarged, image 64/2. Vascular/Lymphatic: Aortic atherosclerosis. No aneurysm. Increased collaterals are identified within both sides of the pelvis and within the ventral pelvic wall. No enlarged abdominal or pelvic lymph nodes. Reproductive: The uterus appears surgically absent there is asymmetric enlargement of the right ovary which may be secondary lady inflamed by the acute appendicitis. Left ovary appears normal. Other: Trace free fluid noted within the dependent portion of the pelvis. No discrete fluid collection identified. No signs of pneumoperitoneum. Musculoskeletal: Status post right hip arthroplasty. No acute or suspicious osseous findings. IMPRESSION: 1. Acute appendicitis. No evidence for perforation or abscess formation. 2. Secondary inflammation of the sigmoid colon and right ovary suspected. 3. Aortic Atherosclerosis (ICD10-I70.0). Electronically Signed   By: Kerby Moors M.D.   On: 03/11/2022 12:51   ? ?Procedures ?Procedures  ? ? ?Medications Ordered in ED ?Medications  ?morphine (PF) 4 MG/ML injection 4 mg (has no administration in time range)  ?ondansetron (ZOFRAN) injection 4 mg (has no administration in time range)  ?sodium chloride 0.9 % bolus 1,000 mL (has no administration in time range)  ? ? ?ED Course/ Medical Decision Making/ A&P ?  ?                        ?Medical Decision Making ?Ashley [name] is a 66 y.o. female here presenting with right lower quadrant pain.  Patient has outpatient CT that showed appendicitis.  I consulted surgery and discussed with Dr. Marlou Starks.  They were already expecting her coming.  We will get CBC and will give pain medicine and surgery to admit. ? ? ?Problems Addressed: ?Acute appendicitis, unspecified acute appendicitis type: acute illness or  injury ? ?Amount and/or Complexity of Data Reviewed ?Labs: ordered. Decision-making details documented in ED Course. ?Radiology: ordered and independent interpretation performed. Decision-making details documented in ED Course. ? ?Risk ?Prescription drug management. ?Decision regarding hospitalization. ? ?Final Clinical Impression(s) / ED Diagnoses ?Final diagnoses:  ?None  ? ? ?Rx / DC Orders ?ED Discharge Orders   ? ? None  ? ?  ? ? ?  ?Drenda Freeze, MD ?03/11/22 1554 ? ?

## 2022-03-11 NOTE — Progress Notes (Signed)
Pharmacy Antibiotic Note ? ?Ashley [name] is a 66 y.o. female admitted on 03/11/2022 with acute phlegmonous appendicitis with abscess. Underwent laparoscopic appendectomy, diagnostic laparoscopy with lysis of adhesions today. Post-operatively, pharmacy has been consulted for Gentamicin dosing x 5 days. Patient also placed on Ciprofloxacin and Metronidazole per MD.  ? ?Plan: ?Gentamicin '400mg'$  ('7mg'$ /kg using adjusted body weight) IV q24h. ?Obtain Gentamicin level 10 hours after initial dose given. ?Monitor renal function, cultures as available, clinical course.  ? ?Height: 5' (152.4 cm) ?Weight: 73.5 kg (162 lb) ?IBW/kg (Calculated) : 45.5 ? ?Temp (24hrs), Avg:98.9 ?F (37.2 ?C), Min:98.5 ?F (36.9 ?C), Max:99.7 ?F (37.6 ?C) ? ?Recent Labs  ?Lab 03/11/22 ?1542  ?WBC 16.7*  ?CREATININE 0.73  ?  ?Estimated Creatinine Clearance: 61.9 mL/min (by C-G formula based on SCr of 0.73 mg/dL).   ? ?Allergies  ?Allergen Reactions  ? Latex Swelling  ? Penicillins   ?  unknown  ? Sulfa Antibiotics   ?  unknown  ? ? ?Antimicrobials this admission: ?5/8 Ciprofloxacin >> (5/13) ?5/8 Metronidazole >> (5/13) ?5/8 Gentamicin >> (5/12) ? ?Microbiology results: ?None ordered ? ?Thank you for allowing pharmacy to be a part of this patient?s care. ? ? ? ?Lindell Spar, PharmD, BCPS ?Clinical Pharmacist  ?03/11/2022 9:36 PM ? ?

## 2022-03-11 NOTE — Anesthesia Procedure Notes (Signed)
Procedure Name: Intubation ?Date/Time: 03/11/2022 7:04 PM ?Performed by: Montel Clock, CRNA ?Pre-anesthesia Checklist: Patient identified, Emergency Drugs available, Suction available, Patient being monitored and Timeout performed ?Patient Re-evaluated:Patient Re-evaluated prior to induction ?Oxygen Delivery Method: Circle system utilized ?Preoxygenation: Pre-oxygenation with 100% oxygen ?Induction Type: IV induction and Rapid sequence ?Laryngoscope Size: Mac and 3 ?Grade View: Grade I ?Tube type: Oral ?Tube size: 7.0 mm ?Number of attempts: 1 ?Airway Equipment and Method: Stylet ?Placement Confirmation: ETT inserted through vocal cords under direct vision, positive ETCO2 and breath sounds checked- equal and bilateral ?Secured at: 21 cm ?Tube secured with: Tape ?Dental Injury: Teeth and Oropharynx as per pre-operative assessment  ? ? ? ? ?

## 2022-03-11 NOTE — ED Triage Notes (Signed)
Pt reports her doc sent her over for acute appendicitis. Pt reports pain 10/10.  ?

## 2022-03-11 NOTE — Transfer of Care (Signed)
Immediate Anesthesia Transfer of Care Note ? ?Patient: Ashley [name] ? ?Procedure(s) Performed: LAPAROSCOPIC  APPENDECTOMY (Abdomen) ?LAPAROSCOPIC LYSIS OF ADHESIONS (Abdomen) ?LAPAROSCOPY DIAGNOSTIC (Abdomen) ? ?Patient Location: PACU ? ?Anesthesia Type:General ? ?Level of Consciousness: drowsy and patient cooperative ? ?Airway & Oxygen Therapy: Patient Spontanous Breathing and Patient connected to face mask oxygen ? ?Post-op Assessment: Report given to RN and Post -op Vital signs reviewed and stable ? ?Post vital signs: Reviewed and stable ? ?Last Vitals:  ?Vitals Value Taken Time  ?BP 132/75 03/11/22 2015  ?Temp    ?Pulse 88 03/11/22 2017  ?Resp    ?SpO2 94 % 03/11/22 2017  ?Vitals shown include unvalidated device data. ? ?Last Pain:  ?Vitals:  ? 03/11/22 1749  ?TempSrc: Oral  ?PainSc: 3   ?   ? ?Patients Stated Pain Goal: 0 (03/11/22 1749) ? ?Complications: No notable events documented. ?

## 2022-03-11 NOTE — H&P (Addendum)
? ? ? ?Ashley [name] ?08/02/1956  ?[id number].   ? ?Requesting MD: Dr. Shirlyn Goltz ?Chief Complaint/Reason for Consult: Acute Appendicitis ? ?HPI: Ashley [name] is a 66 y.o. with a hx of GERD and what sounds like esophageal stricture who presented to the ED on 5/8 for abdominal pain x 3 days.  Her abdominal pain is in the lower abdomen has been progressively worsening with associated nausea and anorexia.  Also woke up diaphoretic this AM. No fever, chills, emesis, cp, sob, urinary symptoms or change in bowel habits. She did have loose stools but this is baseline for her. She presented to her PCPs office for evaluation and had a CT A/P ordered that showed acute appendicitis without perforation or abscess.  She was directed to the ED for admission.  Patient is not any blood thinners.  She has a history of prior tubal ligation and hysterectomy.  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 She is allergic to penicillin and Bactrim (she is unsure of her reaction to these medications). She reports drinking 2 glasses of wine each night. Denies other drug use. Last meal was a small bowl of cereal at 1300. ? ?ROS: ?Review of Systems  ?Constitutional:  Negative for chills and fever.  ?Respiratory:  Negative for cough and shortness of breath.   ?Cardiovascular:  Negative for chest pain.  ?Gastrointestinal:  Positive for abdominal pain, nausea and vomiting. Negative for constipation and diarrhea.  ?Genitourinary: Negative.   ? ?History reviewed. No pertinent family history. ? ?History reviewed. No pertinent past medical history. ? ?History reviewed. No pertinent surgical history. ? ?Social History:  has no history on file for tobacco use, alcohol use, and drug use. ? ?Allergies:  ?Allergies  ?Allergen Reactions  ? Latex   ? Penicillins   ? Sulfa Antibiotics   ? ? ?(Not in a hospital admission) ? ? ? ?Physical Exam: ?Blood pressure (!) 160/102, pulse (!) 111, temperature 98.6 ?F (37  ?C), temperature source Oral, resp. rate 20, SpO2 97 %. ?General: pleasant, WD/WN female who is laying in bed in NAD ?HEENT: head is normocephalic, atraumatic.  Sclera are noninjected.  PERRL.  ?Heart: Tachycardic with regular rhythm.   No obvious murmurs, gallops, or rubs noted.  Palpable pedal pulses bilaterally  ?Lungs:   Respiratory effort nonlabored on room air ?Abd: Soft, ND, RLQ tenderness with peritonitis, +BS, no masses, hernias, or organomegaly ?MS: no BUE/BLE edema, calves soft and nontender ?Skin: warm and dry with no masses, lesions, or rashes ?Psych: A&Ox4 with an appropriate affect ?Neuro: cranial nerves grossly intact, normal speech, thought process intact, moves all extremities, gait not assessed ? ?No results found for this or any previous visit (from the past 48 hour(s)). ?CT ABDOMEN PELVIS W CONTRAST ? ?Result Date: 03/11/2022 ?CLINICAL DATA:  Abdominal pain for 3 days. EXAM: CT ABDOMEN AND PELVIS WITH CONTRAST TECHNIQUE: Multidetector CT imaging of the abdomen and pelvis was performed using the standard protocol following bolus administration of intravenous contrast. RADIATION DOSE REDUCTION: This exam was performed according to the departmental dose-optimization program which includes automated exposure control, adjustment of the mA and/or kV according to patient size and/or use of iterative reconstruction technique. CONTRAST:  39m OMNIPAQUE IOHEXOL 300 MG/ML  SOLN COMPARISON:  None Available. FINDINGS: Lower chest: No pleural effusion or airspace consolidation. Hepatobiliary: Tiny low-density structure within medial aspect of the inferior right lobe of liver measures 6 mm and is too small to characterize. No suspicious focal liver  abnormality. The gallbladder appears normal. No bile duct dilatation. Pancreas: Unremarkable. No pancreatic ductal dilatation or surrounding inflammatory changes. Spleen: Normal in size without focal abnormality. Adrenals/Urinary Tract: Normal adrenal glands. No  kidney mass, nephrolithiasis or hydronephrosis identified. Urinary bladder is unremarkable. Stomach/Bowel: There is marked inflammation involving the appendix which appears distended measuring 2.1 cm in maximum diameter. Small appendicolith is identified within the distal lumen. Surrounding soft tissue stranding is identified. Imaging findings are compatible with acute appendicitis. Signs of secondary inflammation of the sigmoid colon identified, image 66/2. Secondary inflammation of the right ovary is also suspected which appears asymmetrically enlarged, image 64/2. Vascular/Lymphatic: Aortic atherosclerosis. No aneurysm. Increased collaterals are identified within both sides of the pelvis and within the ventral pelvic wall. No enlarged abdominal or pelvic lymph nodes. Reproductive: The uterus appears surgically absent there is asymmetric enlargement of the right ovary which may be secondary lady inflamed by the acute appendicitis. Left ovary appears normal. Other: Trace free fluid noted within the dependent portion of the pelvis. No discrete fluid collection identified. No signs of pneumoperitoneum. Musculoskeletal: Status post right hip arthroplasty. No acute or suspicious osseous findings. IMPRESSION: 1. Acute appendicitis. No evidence for perforation or abscess formation. 2. Secondary inflammation of the sigmoid colon and right ovary suspected. 3. Aortic Atherosclerosis (ICD10-I70.0). Electronically Signed   By: Kerby Moors M.D.   On: 03/11/2022 12:51   ? ?Anti-infectives (From admission, onward)  ? ? None  ? ?  ? ? ?Assessment/Plan ?Acute Appendicitis  ?- History, exam, and imaging personally reviewed and appears c/w acute appendicitis. There is no evidence of perforation or abscess on CT. WBC 16.7. Discussed operative vs non-operative intervention.  I have explained the procedure, risks, and aftercare of Laparoscopic Appendectomy.  Risks include but are not limited to anesthesia (MI, CVA, death), bleeding,  infection, wound problems, hernia, injury to surrounding structures (viscus, nerves, blood vessels, ureter), need for conversion to open procedure or ileocecectomy, post operative ileus or abscess, stump leak, stump appendicitis and increased risk of DVT/PE.  She seems to understand and agrees to proceed with surgery. Admit to [facility] service for observation. Keep NPO. Start IV abx. Will discuss timing of surgery with MD and anesthesia since she ate at 1300. ? ?FEN - NPO, IVF ?VTE - SCDs, Lovenox ?ID - cipro/flagyl ?Foley - None ? ?I reviewed nursing notes, ED provider notes, last 24 h vitals and pain scores, last 48 h intake and output, last 24 h labs and trends, and last 24 h imaging results  ? ?Jillyn Ledger, PA-C ?[facility] ?03/11/2022, 3:45 PM ?Please see Amion for pager number during day hours 7:00am-4:30pm ? ?

## 2022-03-11 NOTE — Op Note (Signed)
?PATIENT:  Ashley [name]  66 y.o. female ? ?Patient Care Team: ?Rankins, Bill Salinas, MD as PCP - General (Family Medicine) ? ?PRE-OPERATIVE DIAGNOSIS:  APPENDICITIS ? ?POST-OPERATIVE DIAGNOSIS:  ACUTE PHLEGMONOUS APPENDICITIS WITH ABSCESS ? ?PROCEDURE:   ?APPENDECTOMY LAPAROSCOPIC ?DIAGNOSTIC LAPAROSCOPY WITH LYSIS OF ADHESIONS  ? ?SURGEON:  Adin Hector, MD ? ?ANESTHESIA:   local and general ? ?EBL:  Total I/O ?In: -  ?Out: 105 [Urine:100; Blood:5] ? ?Delay start of Pharmacological VTE agent (>24hrs) due to surgical blood loss or risk of bleeding:  no ? ?DRAINS: none  ? ?SPECIMEN:  APPENDIX ? ?DISPOSITION OF SPECIMEN:  PATHOLOGY ? ?COUNTS:  YES ? ?PLAN OF CARE: Admit to inpatient  ? ?PATIENT DISPOSITION:  PACU - hemodynamically stable. ? ? ?INDICATIONS: ?Patient with concerning symptoms & work up suspicious for appendicitis.  Surgery was recommended: ? ?The anatomy & physiology of the digestive tract was discussed.  The pathophysiology of appendicitis was discussed.  Natural history risks without surgery was discussed.   I feel the risks of no intervention will lead to serious problems that outweigh the operative risks; therefore, I recommended diagnostic laparoscopy with removal of appendix to remove the pathology.  Laparoscopic & open techniques were discussed.   I noted a good likelihood this will help address the problem.   ? ?Risks such as bleeding, infection, abscess, leak, reoperation, possible ostomy, hernia, heart attack, death, and other risks were discussed.  Goals of post-operative recovery were discussed as well.  We will work to minimize complications.  Questions were answered.  The patient expresses understanding & wishes to proceed with surgery. ? ?OR FINDINGS: Thickened phlegmonous appendix with contained abscess.  Dense inflammatory adhesions of sigmoid colon and right adnexa to this region. ? ?CASE DATA: ? ?Type of patient?: LDOW CASE (Surgical Hospitalist [facility] Inpatient) ? ?Status of Case?  EMERGENT Add On ? ?Infection Present At Time Of Surgery (PATOS)?  PHLEGMON ? ?DESCRIPTION:  ? ?Informed consent was confirmed.  The patient underwent general anaesthesia without difficulty.  The patient was positioned appropriately.  VTE prevention in place.  The patient's abdomen was clipped, prepped, & draped in a sterile fashion.  Surgical timeout confirmed our plan. ? ?Peritoneal entry with a laparoscopic port was obtained using optical entry technique in the left upper abdomen as the patient was positioned in reverse Trendelenburg.  Entry was clean.  I induced carbon dioxide insufflation.  Camera inspection revealed no injury.  Extra ports were carefully placed under direct laparoscopic visualization. ? Upon entering the abdomen (organ space), I encountered a phlegmon involving the right lower quadrant including sigmoid colon and eventually appendix . ? ?I freed the sigmoid colon off the right lower to expose a very inflamed appendix with what appeared to be a abscess within the mesoappendix.  Freed half adhesions of greater epiploic appendages of the sigmoid colon to the right adnexa and the appendix and ileocecal region.  Relieved a closed-loop like situation.  Freed the right adnexa especially and enlarged fallopian tube off to identify a thick phlegmonous appendix.  I mobilized it off the retroperitoneum and was able to elevate it.  I was able to transect the mesoappendix and skeletonized the posterior cecal wall to get a healthy cuff.   The base of the appendix which was still viable.  I stapled the appendix off the cecum using a laparoscopic stapler.  I placed the appendix inside a laparoscopic bag and removed out the 41m stapler port, having to extend the incision on the  skin to get the thickened phlegmonous appendix out ? ?I did copious irrigation. Hemostasis was good in the mesoappendix, colon mesentery, and retroperitoneum. Staple line was intact on the cecum with no bleeding. I washed out the  pelvis, retrohepatic space and right paracolic gutter. I washed out the left side as well.  Hemostasis is good. There was no perforation or injury. Because the area cleaned up well after irrigation, I did not place a drain.  I closed the 12 mm stapler port site fascia with #1 PDS suture primarily.   I aspirated the carbon dioxide. Ports removed.  I closed skin using 4-0 monocryl stitch.  Sterile dressings applied. ? ?Patient was extubated and sent to the recovery room.   I suspect the patient is going used in the hospital at least overnight and will need antibiotics for 5 days. I discussed operative findings, updated the patient's status, discussed probable steps to recovery, and gave postoperative recommendations to the patient's daughter, Nai Borromeo.   Recommendations were made.  Questions were answered.  She expressed understanding & appreciation.   ? ?Adin Hector, M.D., F.A.C.S. ?Gastrointestinal and Minimally Invasive Surgery ?[facility] ?[address] ?[place], [place] [address] ?[phone] Main / Paging ? ?03/11/2022 ?7:56 PM  ?

## 2022-03-12 ENCOUNTER — Encounter (HOSPITAL_COMMUNITY): Payer: Self-pay | Admitting: Surgery

## 2022-03-12 LAB — CREATININE, SERUM
Creatinine, Ser: 0.49 mg/dL (ref 0.44–1.00)
GFR, Estimated: >60 mL/min (ref >60)

## 2022-03-12 MED ORDER — IBUPROFEN 200 MG PO TABS: 400.0000 mg | ORAL_TABLET | Freq: Three times a day (TID) | ORAL | Status: AC | PRN

## 2022-03-12 MED ORDER — TRAMADOL HCL 50 MG PO TABS: 50.0000 mg | ORAL_TABLET | Freq: Four times a day (QID) | ORAL | 0 refills | Status: AC | PRN

## 2022-03-12 MED ORDER — METRONIDAZOLE 500 MG PO TABS: 500.0000 mg | ORAL_TABLET | Freq: Two times a day (BID) | ORAL | 0 refills | Status: AC

## 2022-03-12 MED ORDER — ACETAMINOPHEN 500 MG PO TABS: 1000.0000 mg | ORAL_TABLET | Freq: Four times a day (QID) | ORAL | 0 refills | Status: AC | PRN

## 2022-03-12 MED ORDER — CIPROFLOXACIN HCL 500 MG PO TABS: 500.0000 mg | ORAL_TABLET | Freq: Two times a day (BID) | ORAL | 0 refills | Status: AC

## 2022-03-12 NOTE — Care Management Obs Status (Signed)
MEDICARE OBSERVATION STATUS NOTIFICATION ? ? ?Patient Details  ?Name: Ashley [name] ?MRN: [id number] ?Date of Birth: 09-23-1956 ? ? ?Medicare Observation Status Notification Given:  Yes ? ? ? ?Philomena Buttermore, LCSW ?03/12/2022, 10:22 AM ?

## 2022-03-12 NOTE — Progress Notes (Deleted)
[facility] Discharge Summary   Patient ID: Ashley [name] MRN: [id number] DOB/AGE: 1956-01-01 66 y.o.  Admit date: 03/11/2022 Discharge date: 03/12/2022  Admitting Diagnosis: appendicitis  Discharge Diagnosis Patient Active Problem List   Diagnosis Date Noted   Acute phlegmonous appendicitis s/p lap appendectomy 03/09/2022 03/11/2022   Body mass index (BMI) 31.0-31.9, adult 03/11/2022   Gastro-esophageal reflux disease without esophagitis 03/11/2022   Osteoporosis 03/11/2022   Peripheral venous insufficiency 03/11/2022   Acute stress disorder 03/11/2022   Acute appendicitis with peritoneal abscess 03/11/2022    Consultants None   Imaging: CT ABDOMEN PELVIS W CONTRAST  Result Date: 03/11/2022 CLINICAL DATA:  Abdominal pain for 3 days. EXAM: CT ABDOMEN AND PELVIS WITH CONTRAST TECHNIQUE: Multidetector CT imaging of the abdomen and pelvis was performed using the standard protocol following bolus administration of intravenous contrast. RADIATION DOSE REDUCTION: This exam was performed according to the departmental dose-optimization program which includes automated exposure control, adjustment of the mA and/or kV according to patient size and/or use of iterative reconstruction technique. CONTRAST:  21m OMNIPAQUE IOHEXOL 300 MG/ML  SOLN COMPARISON:  None Available. FINDINGS: Lower chest: No pleural effusion or airspace consolidation. Hepatobiliary: Tiny low-density structure within medial aspect of the inferior right lobe of liver measures 6 mm and is too small to characterize. No suspicious focal liver abnormality. The gallbladder appears normal. No bile duct dilatation. Pancreas: Unremarkable. No pancreatic ductal dilatation or surrounding inflammatory changes. Spleen: Normal in size without focal abnormality. Adrenals/Urinary Tract: Normal adrenal glands. No kidney mass, nephrolithiasis or hydronephrosis identified. Urinary bladder is unremarkable. Stomach/Bowel: There is marked  inflammation involving the appendix which appears distended measuring 2.1 cm in maximum diameter. Small appendicolith is identified within the distal lumen. Surrounding soft tissue stranding is identified. Imaging findings are compatible with acute appendicitis. Signs of secondary inflammation of the sigmoid colon identified, image 66/2. Secondary inflammation of the right ovary is also suspected which appears asymmetrically enlarged, image 64/2. Vascular/Lymphatic: Aortic atherosclerosis. No aneurysm. Increased collaterals are identified within both sides of the pelvis and within the ventral pelvic wall. No enlarged abdominal or pelvic lymph nodes. Reproductive: The uterus appears surgically absent there is asymmetric enlargement of the right ovary which may be secondary lady inflamed by the acute appendicitis. Left ovary appears normal. Other: Trace free fluid noted within the dependent portion of the pelvis. No discrete fluid collection identified. No signs of pneumoperitoneum. Musculoskeletal: Status post right hip arthroplasty. No acute or suspicious osseous findings. IMPRESSION: 1. Acute appendicitis. No evidence for perforation or abscess formation. 2. Secondary inflammation of the sigmoid colon and right ovary suspected. 3. Aortic Atherosclerosis (ICD10-I70.0). Electronically Signed   By: TKerby MoorsM.D.   On: 03/11/2022 12:51    Procedures  Dr. SMichael Boston(03/11/2022)- Laparoscopic Appendectomy  Hospital Course:  66y/o F who presented to the ED at the direction of per PCP after CT revealed acute appendicitis. Patient had 3 days of abdominal pain, gradually worsening, associated with diaphoresis, poor PO intake, and nausea. Patient was admitted and underwent procedure listed above where appendix was noted to have an associated phlegmon but no gross perforation. Tolerated procedure well and was transferred to the floor.  Diet was advanced as tolerated.  On POD#1, the patient was voiding well,  tolerating diet, ambulating well, pain well controlled, vital signs stable, incisions c/d/i and felt stable for discharge home.  Patient will follow up in our office in 2-4 weeks and knows to call with questions or concerns.  She will  call to confirm appointment date/time.    Physical Exam: General:  Alert, NAD, pleasant, comfortable Abd:  Soft, ND, mild tenderness, incisions C/D/I covered with tegaderm  Allergies as of 03/12/2022       Reactions   Latex Swelling   Penicillins    unknown   Sulfa Antibiotics    unknown        Medication List     TAKE these medications    acetaminophen 500 MG tablet Commonly known as: TYLENOL Take 2 tablets (1,000 mg total) by mouth every 6 (six) hours as needed.   ciprofloxacin 500 MG tablet Commonly known as: CIPRO Take 1 tablet (500 mg total) by mouth 2 (two) times daily for 5 days.   glucosamine-chondroitin 500-400 MG tablet Take 1 tablet by mouth daily.   ibuprofen 200 MG tablet Commonly known as: ADVIL Take 2-3 tablets (400-600 mg total) by mouth every 8 (eight) hours as needed for mild pain or moderate pain.   metroNIDAZOLE 500 MG tablet Commonly known as: Flagyl Take 1 tablet (500 mg total) by mouth 2 (two) times daily with a meal. DO NOT CONSUME ALCOHOL WHILE TAKING THIS MEDICATION.   multivitamin with minerals Tabs tablet Take 1 tablet by mouth daily.   omeprazole 40 MG capsule Commonly known as: PRILOSEC Take 40 mg by mouth daily.   PROBIOTIC PO Take 1 tablet by mouth daily.   traMADol 50 MG tablet Commonly known as: ULTRAM Take 1 tablet (50 mg total) by mouth every 6 (six) hours as needed for moderate pain (not relieved by tylenol/advil).   VITAMIN D PO Take 1 tablet by mouth daily.          Follow-up Information     Maczis, Carlena Hurl, PA-C Follow up.   Specialty: General Surgery Why: our office is scheduling you for post-operative follow up. Please call to confirm appointment date/time. Contact  information: [place] [address] [phone]                 Signed: Obie Dredge, [facility] 03/12/2022, 8:55 AM

## 2022-03-12 NOTE — Care Management CC44 (Signed)
Condition Code 44 Documentation Completed ? ?Patient Details  ?Name: [name] ?MRN: [id number] ?Date of Birth: 1956/09/24 ? ? ?Condition Code 44 given:  Yes ?Patient signature on Condition Code 44 notice:  Yes ?Documentation of 2 MD's agreement:  Yes ?Code 44 added to claim:  Yes ? ? ? ?Kili Gracy, LCSW ?03/12/2022, 10:22 AM ? ?

## 2022-03-12 NOTE — Progress Notes (Signed)
24 hour chart audit completed 

## 2022-03-12 NOTE — Anesthesia Postprocedure Evaluation (Signed)
Anesthesia Post Note ? ?Patient: Ashley [name] ? ?Procedure(s) Performed: LAPAROSCOPIC  APPENDECTOMY (Abdomen) ?LAPAROSCOPIC LYSIS OF ADHESIONS (Abdomen) ?LAPAROSCOPY DIAGNOSTIC (Abdomen) ? ?  ? ?Patient location during evaluation: PACU ?Anesthesia Type: General ?Level of consciousness: awake ?Pain management: pain level controlled ?Vital Signs Assessment: post-procedure vital signs reviewed and stable ?Respiratory status: spontaneous breathing, nonlabored ventilation, respiratory function stable and patient connected to nasal cannula oxygen ?Cardiovascular status: blood pressure returned to baseline and stable ?Postop Assessment: no apparent nausea or vomiting ?Anesthetic complications: no ? ? ?No notable events documented. ? ?Last Vitals:  ?Vitals:  ? 03/12/22 0150 03/12/22 0453  ?BP: 107/72 104/65  ?Pulse: 80 64  ?Resp: 16 16  ?Temp: 36.5 ?C 36.6 ?C  ?SpO2: 92% 94%  ?  ?Last Pain:  ?Vitals:  ? 03/12/22 0736  ?TempSrc:   ?PainSc: 0-No pain  ? ? ?  ?  ?  ?  ?  ?  ? ?Govind Furey P Bartholomew Ramesh ? ? ? ? ?

## 2022-03-12 NOTE — Progress Notes (Signed)
Patient was given discharge instructions, and all questions were answered.  Patient was stable for discharge and was taken to the main exit by wheelchair. 

## 2022-03-14 LAB — SURGICAL PATHOLOGY

## 2022-03-21 NOTE — Discharge Summary (Signed)
Ashley [name] Discharge Summary    Patient ID: Ashley [name] MRN: [id number] DOB/AGE: 67/08/1956 67 y.o.   Admit date: 03/11/2022 Discharge date: 03/12/2022   Admitting Diagnosis: appendicitis   Discharge Diagnosis     Patient Active Problem List    Diagnosis Date Noted   Acute phlegmonous appendicitis s/p lap appendectomy 03/09/2022 03/11/2022   Body mass index (BMI) 31.0-31.9, adult 03/11/2022   Gastro-esophageal reflux disease without esophagitis 03/11/2022   Osteoporosis 03/11/2022   Peripheral venous insufficiency 03/11/2022   Acute stress disorder 03/11/2022   Acute appendicitis with peritoneal abscess 03/11/2022      Consultants None    Imaging:  Imaging Results (Last 48 hours)  CT ABDOMEN PELVIS W CONTRAST   Result Date: 03/11/2022 CLINICAL DATA:  Abdominal pain for 3 days. EXAM: CT ABDOMEN AND PELVIS WITH CONTRAST TECHNIQUE: Multidetector CT imaging of the abdomen and pelvis was performed using the standard protocol following bolus administration of intravenous contrast. RADIATION DOSE REDUCTION: This exam was performed according to the departmental dose-optimization program which includes automated exposure control, adjustment of the mA and/or kV according to patient size and/or use of iterative reconstruction technique. CONTRAST:  56m OMNIPAQUE IOHEXOL 300 MG/ML  SOLN COMPARISON:  None Available. FINDINGS: Lower chest: No pleural effusion or airspace consolidation. Hepatobiliary: Tiny low-density structure within medial aspect of the inferior right lobe of liver measures 6 mm and is too small to characterize. No suspicious focal liver abnormality. The gallbladder appears normal. No bile duct dilatation. Pancreas: Unremarkable. No pancreatic ductal dilatation or surrounding inflammatory changes. Spleen: Normal in size without focal abnormality. Adrenals/Urinary Tract: Normal adrenal glands. No kidney mass, nephrolithiasis or hydronephrosis identified. Urinary bladder  is unremarkable. Stomach/Bowel: There is marked inflammation involving the appendix which appears distended measuring 2.1 cm in maximum diameter. Small appendicolith is identified within the distal lumen. Surrounding soft tissue stranding is identified. Imaging findings are compatible with acute appendicitis. Signs of secondary inflammation of the sigmoid colon identified, image 66/2. Secondary inflammation of the right ovary is also suspected which appears asymmetrically enlarged, image 64/2. Vascular/Lymphatic: Aortic atherosclerosis. No aneurysm. Increased collaterals are identified within both sides of the pelvis and within the ventral pelvic wall. No enlarged abdominal or pelvic lymph nodes. Reproductive: The uterus appears surgically absent there is asymmetric enlargement of the right ovary which may be secondary lady inflamed by the acute appendicitis. Left ovary appears normal. Other: Trace free fluid noted within the dependent portion of the pelvis. No discrete fluid collection identified. No signs of pneumoperitoneum. Musculoskeletal: Status post right hip arthroplasty. No acute or suspicious osseous findings. IMPRESSION: 1. Acute appendicitis. No evidence for perforation or abscess formation. 2. Secondary inflammation of the sigmoid colon and right ovary suspected. 3. Aortic Atherosclerosis (ICD10-I70.0). Electronically Signed   By: TKerby MoorsM.D.   On: 03/11/2022 12:51      Procedures   Dr. SMichael Boston(03/11/2022)- Laparoscopic Appendectomy   Hospital Course:  66y/o F who presented to the ED at the direction of per PCP after CT revealed acute appendicitis. Patient had 3 days of abdominal pain, gradually worsening, associated with diaphoresis, poor PO intake, and nausea. Patient was admitted and underwent procedure listed above where appendix was noted to have an associated phlegmon but no gross perforation. Tolerated procedure well and was transferred to the floor.  Diet was advanced as  tolerated.  On POD#1, the patient was voiding well, tolerating diet, ambulating well, pain well controlled, vital signs stable, incisions c/d/i and felt stable for  discharge home.  Patient will follow up in our office in 2-4 weeks and knows to call with questions or concerns.  She will call to confirm appointment date/time.     Physical Exam: General:  Alert, NAD, pleasant, comfortable Abd:  Soft, ND, mild tenderness, incisions C/D/I covered with tegaderm   Allergies as of 03/12/2022         Reactions    Latex Swelling    Penicillins      unknown    Sulfa Antibiotics      unknown            Medication List       TAKE these medications     acetaminophen 500 MG tablet Commonly known as: TYLENOL Take 2 tablets (1,000 mg total) by mouth every 6 (six) hours as needed.    ciprofloxacin 500 MG tablet Commonly known as: CIPRO Take 1 tablet (500 mg total) by mouth 2 (two) times daily for 5 days.    glucosamine-chondroitin 500-400 MG tablet Take 1 tablet by mouth daily.    ibuprofen 200 MG tablet Commonly known as: ADVIL Take 2-3 tablets (400-600 mg total) by mouth every 8 (eight) hours as needed for mild pain or moderate pain.    metroNIDAZOLE 500 MG tablet Commonly known as: Flagyl Take 1 tablet (500 mg total) by mouth 2 (two) times daily with a meal. DO NOT CONSUME ALCOHOL WHILE TAKING THIS MEDICATION.    multivitamin with minerals Tabs tablet Take 1 tablet by mouth daily.    omeprazole 40 MG capsule Commonly known as: PRILOSEC Take 40 mg by mouth daily.    PROBIOTIC PO Take 1 tablet by mouth daily.    traMADol 50 MG tablet Commonly known as: ULTRAM Take 1 tablet (50 mg total) by mouth every 6 (six) hours as needed for moderate pain (not relieved by tylenol/advil).    VITAMIN D PO Take 1 tablet by mouth daily.                 Follow-up Information       Maczis, Carlena Hurl, PA-C Follow up.   Specialty: General [name] Why: our office is scheduling you for  post-operative follow up. Please call to confirm appointment date/time. Contact information: [address] [place] [address] [phone]                          Signed: Obie Dredge, [name] 03/12/2022, 8:55 AM

## 2022-06-09 IMAGING — CT CT ABD-PELV W/ CM
2 of 5 series · 16 of 46 positions shown, 18 images · IV contrast (APPLIED)
Comparison: None Available.

CLINICAL DATA: Abdominal pain for 3 days.

EXAM:
CT ABDOMEN AND PELVIS WITH CONTRAST
TECHNIQUE: Multidetector CT imaging of the abdomen and pelvis was performed
using the standard protocol following bolus administration of
intravenous contrast.

[Series 2: abd pel w · axial · 0.79mm/px · z∈[+802,+1212]mm · 13 of 94 slices shown, 15 images]
[im 6/94  soft-tissue]
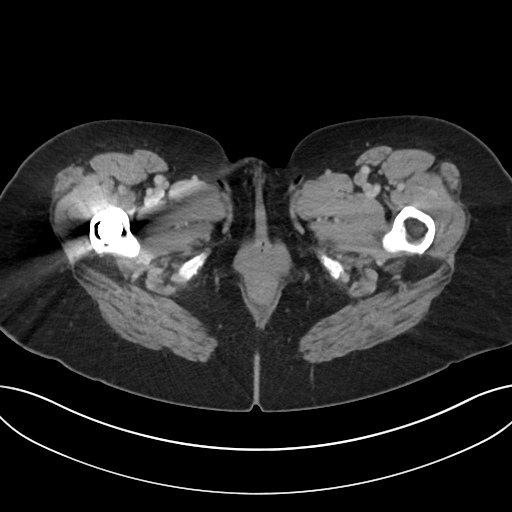
[im 6/94  bone]
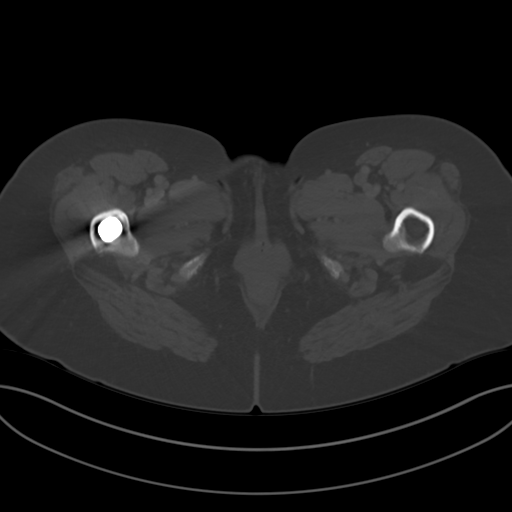
[im 11/94  soft-tissue]
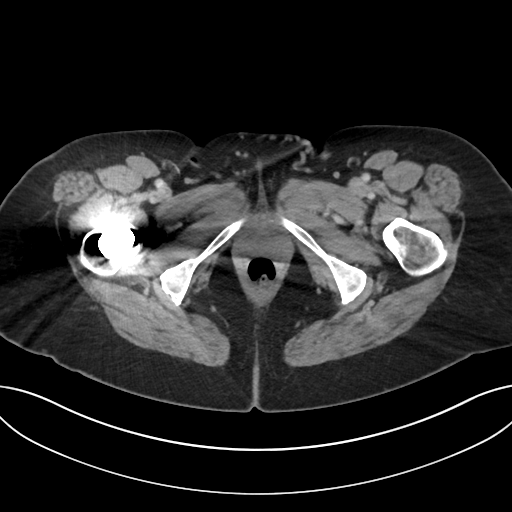
[im 21/94  soft-tissue]
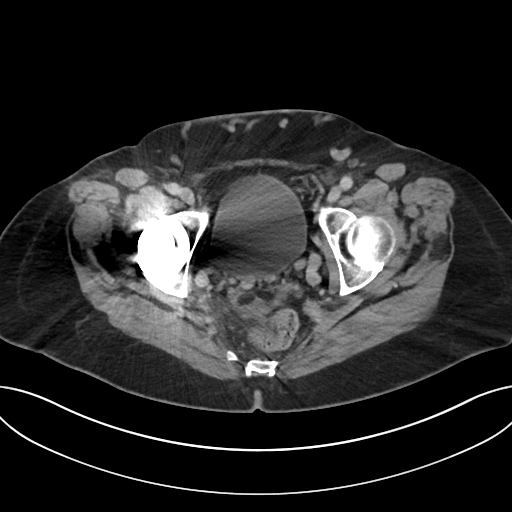
[im 26/94  soft-tissue]
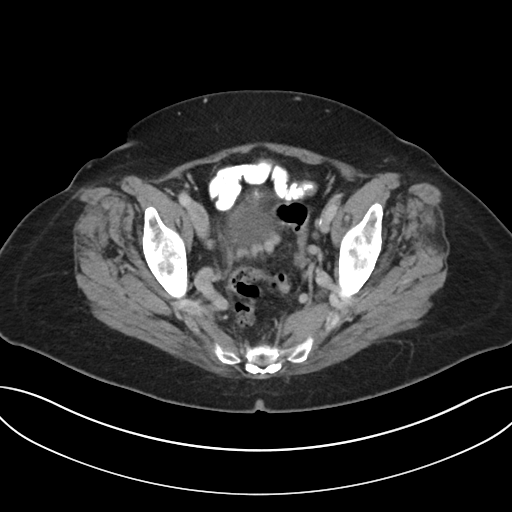
[im 32/94  soft-tissue]
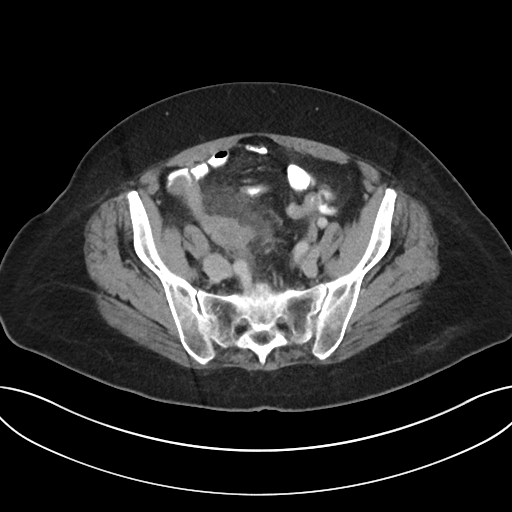
[im 42/94  soft-tissue]
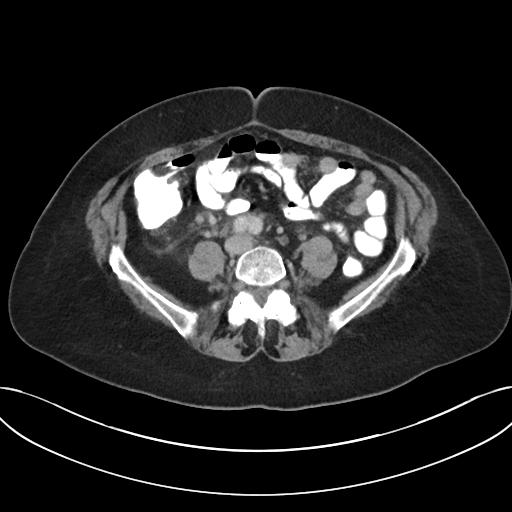
[im 47/94  soft-tissue]
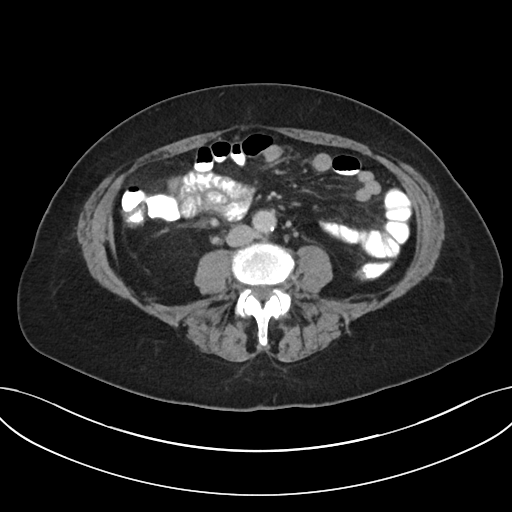
[im 52/94  soft-tissue]
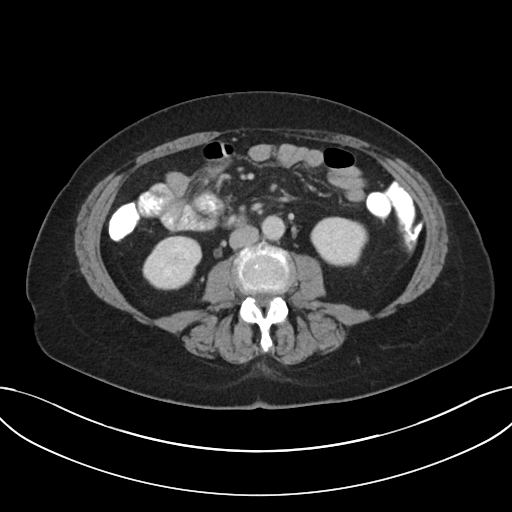
[im 63/94  soft-tissue]
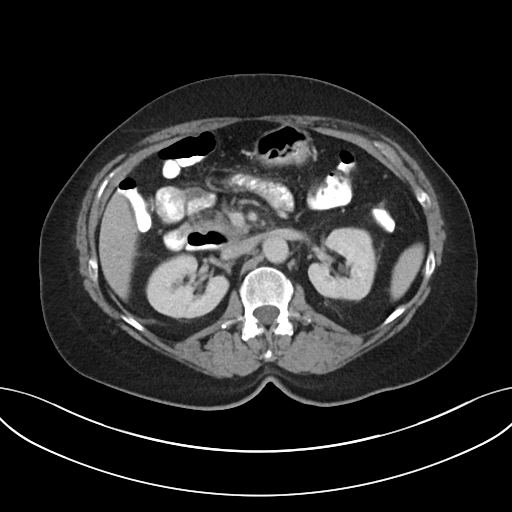
[im 63/94  bone]
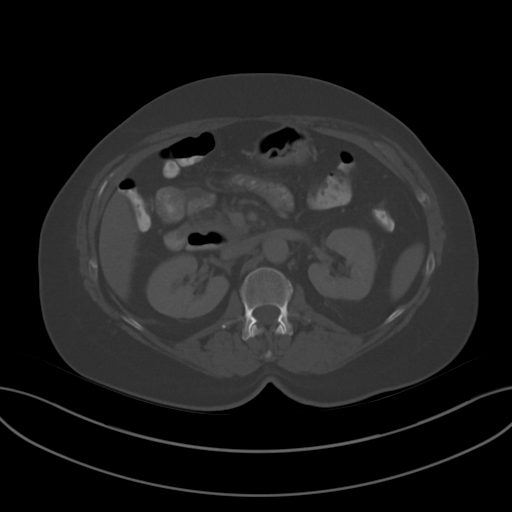
[im 68/94  soft-tissue]
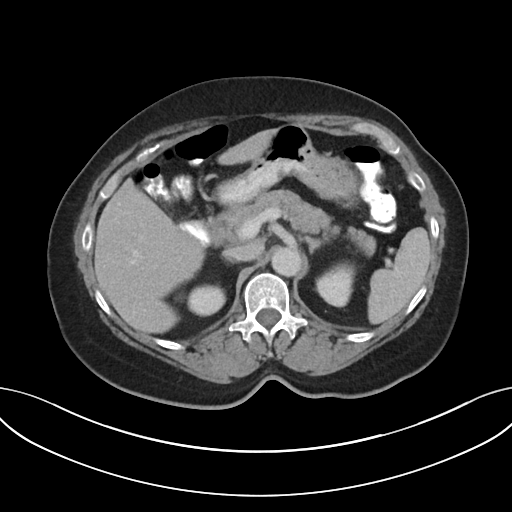
[im 73/94  soft-tissue]
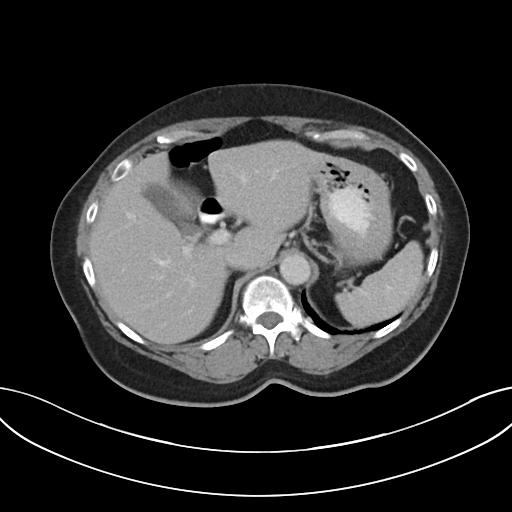
[im 83/94  soft-tissue]
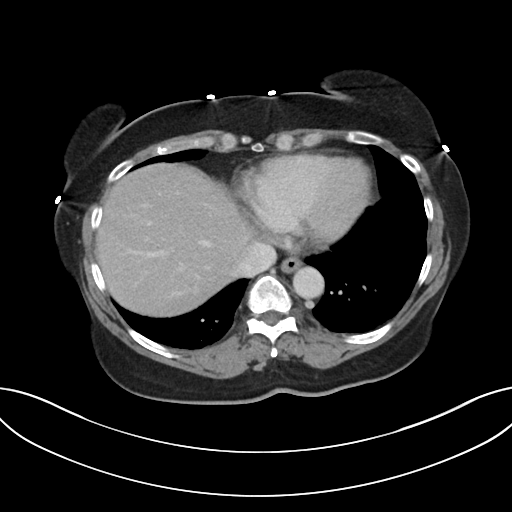
[im 88/94  soft-tissue]
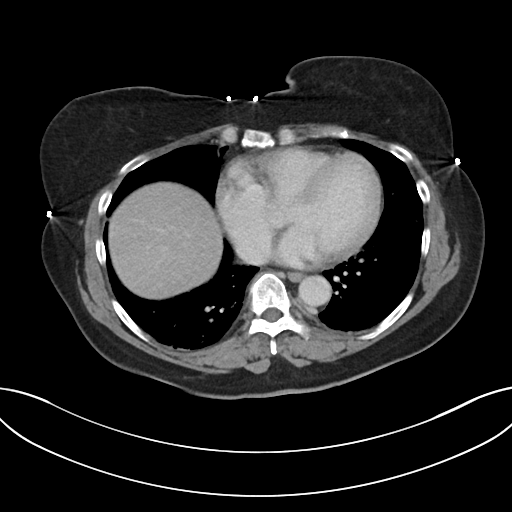

[Series 5: coronal · coronal · 0.83mm/px · 3 of 96 slices shown]
[im 32/96  soft-tissue]
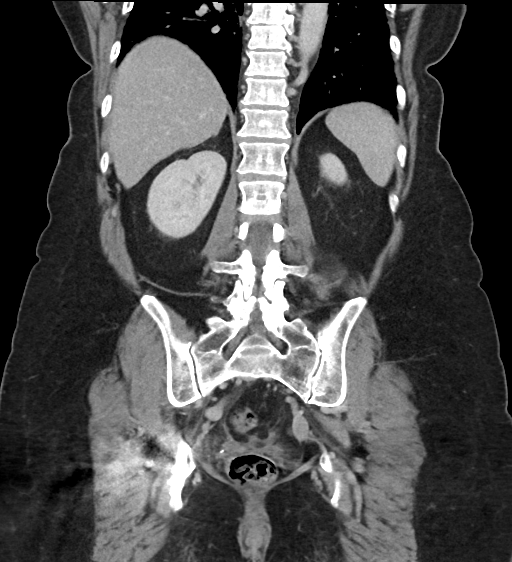
[im 43/96  soft-tissue]
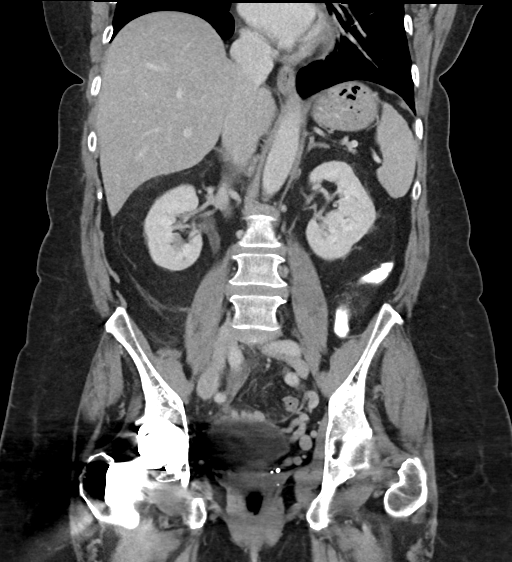
[im 53/96  soft-tissue]
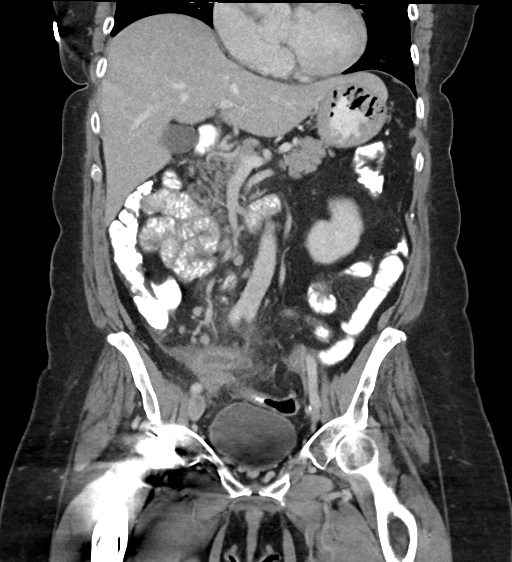

[16 of 46 positions shown; findings below may reference images not displayed]

RADIATION DOSE REDUCTION: This exam was performed according to the
departmental dose-optimization program which includes automated
exposure control, adjustment of the mA and/or kV according to
patient size and/or use of iterative reconstruction technique.

CONTRAST:  75mL OMNIPAQUE IOHEXOL 300 MG/ML  SOLN
FINDINGS: Lower chest: No pleural effusion or airspace consolidation.

Hepatobiliary: Tiny low-density structure within medial aspect of
the inferior right lobe of liver measures 6 mm and is too small to
characterize. No suspicious focal liver abnormality. The gallbladder
appears normal. No bile duct dilatation.

Pancreas: Unremarkable. No pancreatic ductal dilatation or
surrounding inflammatory changes.

Spleen: Normal in size without focal abnormality.

Adrenals/Urinary Tract: Normal adrenal glands. No kidney mass,
nephrolithiasis or hydronephrosis identified. Urinary bladder is
unremarkable.

Stomach/Bowel: There is marked inflammation involving the appendix
which appears distended measuring 2.1 cm in maximum diameter. Small
appendicolith is identified within the distal lumen. Surrounding
soft tissue stranding is identified. Imaging findings are compatible
with acute appendicitis. Signs of secondary inflammation of the
sigmoid colon identified, image 66/2. Secondary inflammation of the
right ovary is also suspected which appears asymmetrically enlarged,
image 64/2.

Vascular/Lymphatic: Aortic atherosclerosis. No aneurysm. Increased
collaterals are identified within both sides of the pelvis and
within the ventral pelvic wall. No enlarged abdominal or pelvic
lymph nodes.

Reproductive: The uterus appears surgically absent there is
asymmetric enlargement of the right ovary which may be secondary
lady inflamed by the acute appendicitis. Left ovary appears normal.

Other: Trace free fluid noted within the dependent portion of the
pelvis. No discrete fluid collection identified. No signs of
pneumoperitoneum.

Musculoskeletal: Status post right hip arthroplasty. No acute or
suspicious osseous findings.
IMPRESSION: 1. Acute appendicitis. No evidence for perforation or abscess
formation.
2. Secondary inflammation of the sigmoid colon and right ovary
suspected.
3. Aortic Atherosclerosis ([id number].R).

## 2022-07-17 ENCOUNTER — Other Ambulatory Visit: Payer: Self-pay | Admitting: Family Medicine

## 2022-07-17 DIAGNOSIS — Z1231 Encounter for screening mammogram for malignant neoplasm of breast: Secondary | ICD-10-CM

## 2022-07-18 DIAGNOSIS — Z6831 Body mass index (BMI) 31.0-31.9, adult: Secondary | ICD-10-CM | POA: Diagnosis not present

## 2022-11-29 DIAGNOSIS — Z0000 Encounter for general adult medical examination without abnormal findings: Secondary | ICD-10-CM | POA: Diagnosis not present

## 2022-11-29 DIAGNOSIS — Z13220 Encounter for screening for lipoid disorders: Secondary | ICD-10-CM | POA: Diagnosis not present

## 2022-12-24 ENCOUNTER — Ambulatory Visit
Admission: RE | Admit: 2022-12-24 | Discharge: 2022-12-24 | Disposition: A | Discharge status: Home / Self Care | Payer: PPO | Source: Ambulatory Visit | Attending: Family Medicine | Admitting: Family Medicine

## 2022-12-24 DIAGNOSIS — M810 Age-related osteoporosis without current pathological fracture: Secondary | ICD-10-CM | POA: Diagnosis not present

## 2022-12-24 DIAGNOSIS — Z1231 Encounter for screening mammogram for malignant neoplasm of breast: Secondary | ICD-10-CM

## 2022-12-24 DIAGNOSIS — Z780 Asymptomatic menopausal state: Secondary | ICD-10-CM | POA: Diagnosis not present

## 2023-01-10 DIAGNOSIS — Z20822 Contact with and (suspected) exposure to covid-19: Secondary | ICD-10-CM | POA: Diagnosis not present

## 2023-01-11 DIAGNOSIS — Z20822 Contact with and (suspected) exposure to covid-19: Secondary | ICD-10-CM | POA: Diagnosis not present

## 2023-01-16 DIAGNOSIS — Z20822 Contact with and (suspected) exposure to covid-19: Secondary | ICD-10-CM | POA: Diagnosis not present

## 2023-01-18 DIAGNOSIS — Z20822 Contact with and (suspected) exposure to covid-19: Secondary | ICD-10-CM | POA: Diagnosis not present

## 2023-01-21 DIAGNOSIS — K580 Irritable bowel syndrome with diarrhea: Secondary | ICD-10-CM | POA: Diagnosis not present

## 2023-01-21 DIAGNOSIS — K219 Gastro-esophageal reflux disease without esophagitis: Secondary | ICD-10-CM | POA: Diagnosis not present

## 2023-01-21 DIAGNOSIS — K200 Eosinophilic esophagitis: Secondary | ICD-10-CM | POA: Diagnosis not present

## 2023-01-21 DIAGNOSIS — Z20822 Contact with and (suspected) exposure to covid-19: Secondary | ICD-10-CM | POA: Diagnosis not present

## 2023-01-21 DIAGNOSIS — E669 Obesity, unspecified: Secondary | ICD-10-CM | POA: Diagnosis not present

## 2023-01-21 DIAGNOSIS — R1310 Dysphagia, unspecified: Secondary | ICD-10-CM | POA: Diagnosis not present

## 2023-01-24 DIAGNOSIS — Z20822 Contact with and (suspected) exposure to covid-19: Secondary | ICD-10-CM | POA: Diagnosis not present

## 2023-01-28 DIAGNOSIS — Z20822 Contact with and (suspected) exposure to covid-19: Secondary | ICD-10-CM | POA: Diagnosis not present

## 2023-01-30 DIAGNOSIS — Z20822 Contact with and (suspected) exposure to covid-19: Secondary | ICD-10-CM | POA: Diagnosis not present

## 2023-02-19 DIAGNOSIS — E785 Hyperlipidemia, unspecified: Secondary | ICD-10-CM | POA: Diagnosis not present

## 2023-02-19 DIAGNOSIS — M1990 Unspecified osteoarthritis, unspecified site: Secondary | ICD-10-CM | POA: Diagnosis not present

## 2023-02-19 DIAGNOSIS — K219 Gastro-esophageal reflux disease without esophagitis: Secondary | ICD-10-CM | POA: Diagnosis not present

## 2023-02-19 DIAGNOSIS — E669 Obesity, unspecified: Secondary | ICD-10-CM | POA: Diagnosis not present

## 2023-02-19 DIAGNOSIS — H259 Unspecified age-related cataract: Secondary | ICD-10-CM | POA: Diagnosis not present

## 2023-02-19 DIAGNOSIS — M810 Age-related osteoporosis without current pathological fracture: Secondary | ICD-10-CM | POA: Diagnosis not present

## 2023-03-04 DIAGNOSIS — Z87410 Personal history of cervical dysplasia: Secondary | ICD-10-CM | POA: Diagnosis not present

## 2023-03-04 DIAGNOSIS — Z9181 History of falling: Secondary | ICD-10-CM | POA: Diagnosis not present

## 2023-03-04 DIAGNOSIS — Z0000 Encounter for general adult medical examination without abnormal findings: Secondary | ICD-10-CM | POA: Diagnosis not present

## 2023-03-04 DIAGNOSIS — M818 Other osteoporosis without current pathological fracture: Secondary | ICD-10-CM | POA: Diagnosis not present

## 2023-03-04 DIAGNOSIS — E7800 Pure hypercholesterolemia, unspecified: Secondary | ICD-10-CM | POA: Diagnosis not present

## 2023-03-04 DIAGNOSIS — F430 Acute stress reaction: Secondary | ICD-10-CM | POA: Diagnosis not present

## 2023-03-04 DIAGNOSIS — Z1321 Encounter for screening for nutritional disorder: Secondary | ICD-10-CM | POA: Diagnosis not present

## 2023-03-04 DIAGNOSIS — K219 Gastro-esophageal reflux disease without esophagitis: Secondary | ICD-10-CM | POA: Diagnosis not present

## 2023-03-04 DIAGNOSIS — Z6830 Body mass index (BMI) 30.0-30.9, adult: Secondary | ICD-10-CM | POA: Diagnosis not present

## 2023-03-05 DIAGNOSIS — K219 Gastro-esophageal reflux disease without esophagitis: Secondary | ICD-10-CM | POA: Diagnosis not present

## 2023-03-05 DIAGNOSIS — E7800 Pure hypercholesterolemia, unspecified: Secondary | ICD-10-CM | POA: Diagnosis not present

## 2023-03-05 DIAGNOSIS — Z1321 Encounter for screening for nutritional disorder: Secondary | ICD-10-CM | POA: Diagnosis not present

## 2023-03-06 DIAGNOSIS — M461 Sacroiliitis, not elsewhere classified: Secondary | ICD-10-CM | POA: Diagnosis not present

## 2023-03-13 ENCOUNTER — Other Ambulatory Visit: Payer: Self-pay

## 2023-03-13 ENCOUNTER — Telehealth: Payer: Self-pay | Admitting: Pharmacy Technician

## 2023-03-13 NOTE — Telephone Encounter (Signed)
Auth Submission: NO AUTH NEEDED Site of care: Site of care: [facility] Payer: [employer] Medication & CPT/J Code(s) submitted: Reclast (Zolendronic acid) [id number] Route of submission (phone, fax, portal):  Phone # [phone] Fax # Auth type: Buy/Bill Units/visits requested: 1 Reference number: [id number] Approval from: 03/13/23 to 11/04/23

## 2023-03-17 DIAGNOSIS — M461 Sacroiliitis, not elsewhere classified: Secondary | ICD-10-CM | POA: Diagnosis not present

## 2023-03-19 DIAGNOSIS — M461 Sacroiliitis, not elsewhere classified: Secondary | ICD-10-CM | POA: Diagnosis not present

## 2023-03-26 DIAGNOSIS — M461 Sacroiliitis, not elsewhere classified: Secondary | ICD-10-CM | POA: Diagnosis not present

## 2023-03-28 DIAGNOSIS — M461 Sacroiliitis, not elsewhere classified: Secondary | ICD-10-CM | POA: Diagnosis not present

## 2023-04-21 ENCOUNTER — Ambulatory Visit: Payer: PPO

## 2023-04-23 DIAGNOSIS — M461 Sacroiliitis, not elsewhere classified: Secondary | ICD-10-CM | POA: Diagnosis not present

## 2023-04-30 ENCOUNTER — Ambulatory Visit (INDEPENDENT_AMBULATORY_CARE_PROVIDER_SITE_OTHER): Payer: PPO

## 2023-04-30 VITALS — BP 142/88 | HR 77 | Temp 98.7°F | Resp 20 | Ht 65.0 in | Wt 161.0 lb

## 2023-04-30 DIAGNOSIS — M810 Age-related osteoporosis without current pathological fracture: Secondary | ICD-10-CM | POA: Diagnosis not present

## 2023-04-30 MED ORDER — DIPHENHYDRAMINE HCL 25 MG PO CAPS: 25.0000 mg | ORAL_CAPSULE | Freq: Once | ORAL | Status: DC

## 2023-04-30 MED ORDER — ZOLEDRONIC ACID 5 MG/100ML IV SOLN
5.0000 mg | Freq: Once | INTRAVENOUS | Status: AC
  Administered 2023-04-30: 5 mg via INTRAVENOUS
  Filled 2023-04-30: qty 100

## 2023-04-30 MED ORDER — ACETAMINOPHEN 325 MG PO TABS
650.0000 mg | ORAL_TABLET | Freq: Once | ORAL | Status: AC
  Administered 2023-04-30: 650 mg via ORAL
  Filled 2023-04-30: qty 2

## 2023-04-30 MED ORDER — SODIUM CHLORIDE 0.9 % IV SOLN: INTRAVENOUS | Status: DC

## 2023-04-30 NOTE — Patient Instructions (Signed)

## 2023-04-30 NOTE — Progress Notes (Signed)
Diagnosis: Osteoporosis  Provider:  Chilton Greathouse MD  Procedure: IV Infusion  IV Type: Peripheral, IV Location: L Antecubital  Reclast (Zolendronic Acid), Dose: 5 mg  Infusion Start Time: 1043  Infusion Stop Time: 1115  Post Infusion IV Care: Observation period completed and Peripheral IV Discontinued  Discharge: Condition: Good, Destination: Home . AVS Provided  Performed by:  Adriana Mccallum, RN

## 2023-07-15 DIAGNOSIS — Z87410 Personal history of cervical dysplasia: Secondary | ICD-10-CM | POA: Diagnosis not present

## 2023-07-15 DIAGNOSIS — Z01419 Encounter for gynecological examination (general) (routine) without abnormal findings: Secondary | ICD-10-CM | POA: Diagnosis not present

## 2024-01-08 DIAGNOSIS — L255 Unspecified contact dermatitis due to plants, except food: Secondary | ICD-10-CM | POA: Diagnosis not present

## 2024-02-10 DIAGNOSIS — R509 Fever, unspecified: Secondary | ICD-10-CM | POA: Diagnosis not present

## 2024-02-10 DIAGNOSIS — R051 Acute cough: Secondary | ICD-10-CM | POA: Diagnosis not present

## 2024-02-10 DIAGNOSIS — J029 Acute pharyngitis, unspecified: Secondary | ICD-10-CM | POA: Diagnosis not present

## 2024-02-10 DIAGNOSIS — U071 COVID-19: Secondary | ICD-10-CM | POA: Diagnosis not present

## 2024-02-19 DIAGNOSIS — K200 Eosinophilic esophagitis: Secondary | ICD-10-CM | POA: Diagnosis not present

## 2024-02-19 DIAGNOSIS — K219 Gastro-esophageal reflux disease without esophagitis: Secondary | ICD-10-CM | POA: Diagnosis not present

## 2024-02-19 DIAGNOSIS — Z1211 Encounter for screening for malignant neoplasm of colon: Secondary | ICD-10-CM | POA: Diagnosis not present

## 2024-02-19 DIAGNOSIS — E669 Obesity, unspecified: Secondary | ICD-10-CM | POA: Diagnosis not present

## 2024-03-10 DIAGNOSIS — F430 Acute stress reaction: Secondary | ICD-10-CM | POA: Diagnosis not present

## 2024-03-10 DIAGNOSIS — M818 Other osteoporosis without current pathological fracture: Secondary | ICD-10-CM | POA: Diagnosis not present

## 2024-03-10 DIAGNOSIS — E7800 Pure hypercholesterolemia, unspecified: Secondary | ICD-10-CM | POA: Diagnosis not present

## 2024-03-10 DIAGNOSIS — Z6830 Body mass index (BMI) 30.0-30.9, adult: Secondary | ICD-10-CM | POA: Diagnosis not present

## 2024-03-10 DIAGNOSIS — Z0000 Encounter for general adult medical examination without abnormal findings: Secondary | ICD-10-CM | POA: Diagnosis not present

## 2024-03-10 DIAGNOSIS — I700 Atherosclerosis of aorta: Secondary | ICD-10-CM | POA: Diagnosis not present

## 2024-03-10 DIAGNOSIS — K219 Gastro-esophageal reflux disease without esophagitis: Secondary | ICD-10-CM | POA: Diagnosis not present

## 2024-03-10 DIAGNOSIS — Z87410 Personal history of cervical dysplasia: Secondary | ICD-10-CM | POA: Diagnosis not present

## 2024-03-15 DIAGNOSIS — E7800 Pure hypercholesterolemia, unspecified: Secondary | ICD-10-CM | POA: Diagnosis not present

## 2024-07-07 DIAGNOSIS — Z1211 Encounter for screening for malignant neoplasm of colon: Secondary | ICD-10-CM | POA: Diagnosis not present

## 2024-07-07 DIAGNOSIS — K621 Rectal polyp: Secondary | ICD-10-CM | POA: Diagnosis not present

## 2024-11-17 ENCOUNTER — Other Ambulatory Visit (HOSPITAL_BASED_OUTPATIENT_CLINIC_OR_DEPARTMENT_OTHER): Payer: Self-pay | Admitting: Family Medicine

## 2024-11-17 DIAGNOSIS — M810 Age-related osteoporosis without current pathological fracture: Secondary | ICD-10-CM
